# Patient Record
Sex: Male | Born: 1965 | Race: White | Hispanic: No
Health system: Midwestern US, Community
[De-identification: ages and names within clinical notes are randomized; demographics above are authoritative.]

## PROBLEM LIST (undated history)

## (undated) DIAGNOSIS — K219 Gastro-esophageal reflux disease without esophagitis: Secondary | ICD-10-CM

## (undated) DIAGNOSIS — R0789 Other chest pain: Secondary | ICD-10-CM

## (undated) HISTORY — DX: Gastro-esophageal reflux disease without esophagitis: K21.9

## (undated) HISTORY — PX: LUNG SURGERY: SHX703

---

## 2005-07-27 ENCOUNTER — Emergency Department (HOSPITAL_COMMUNITY): Admission: EM | Admit: 2005-07-27 | Discharge: 2005-07-27 | Payer: Self-pay | Admitting: Emergency Medicine

## 2005-08-06 ENCOUNTER — Emergency Department (HOSPITAL_COMMUNITY): Admission: EM | Admit: 2005-08-06 | Discharge: 2005-08-06 | Payer: Self-pay | Admitting: Family Medicine

## 2006-01-23 ENCOUNTER — Ambulatory Visit: Payer: Self-pay | Admitting: Psychiatry

## 2006-01-24 ENCOUNTER — Inpatient Hospital Stay (HOSPITAL_COMMUNITY): Admission: EM | Admit: 2006-01-24 | Discharge: 2006-01-24 | Payer: Self-pay | Admitting: Psychiatry

## 2006-07-24 ENCOUNTER — Ambulatory Visit: Payer: Self-pay | Admitting: Thoracic Surgery

## 2006-07-24 ENCOUNTER — Inpatient Hospital Stay (HOSPITAL_COMMUNITY): Admission: EM | Admit: 2006-07-24 | Discharge: 2006-08-01 | Payer: Self-pay | Admitting: Emergency Medicine

## 2006-07-25 ENCOUNTER — Encounter (INDEPENDENT_AMBULATORY_CARE_PROVIDER_SITE_OTHER): Payer: Self-pay | Admitting: *Deleted

## 2006-07-25 DIAGNOSIS — J869 Pyothorax without fistula: Secondary | ICD-10-CM | POA: Insufficient documentation

## 2006-07-25 DIAGNOSIS — B957 Other staphylococcus as the cause of diseases classified elsewhere: Secondary | ICD-10-CM

## 2006-07-29 ENCOUNTER — Encounter: Payer: Self-pay | Admitting: Thoracic Surgery (Cardiothoracic Vascular Surgery)

## 2006-07-29 ENCOUNTER — Ambulatory Visit: Payer: Self-pay | Admitting: Infectious Diseases

## 2006-08-12 ENCOUNTER — Encounter
Admission: RE | Admit: 2006-08-12 | Discharge: 2006-08-12 | Payer: Self-pay | Admitting: Thoracic Surgery (Cardiothoracic Vascular Surgery)

## 2006-08-12 ENCOUNTER — Encounter: Payer: Self-pay | Admitting: Infectious Diseases

## 2006-08-12 ENCOUNTER — Ambulatory Visit: Payer: Self-pay | Admitting: Thoracic Surgery (Cardiothoracic Vascular Surgery)

## 2006-08-22 ENCOUNTER — Ambulatory Visit: Payer: Self-pay | Admitting: Infectious Diseases

## 2006-08-22 DIAGNOSIS — Z9889 Other specified postprocedural states: Secondary | ICD-10-CM | POA: Insufficient documentation

## 2007-06-01 ENCOUNTER — Emergency Department (HOSPITAL_COMMUNITY): Admission: EM | Admit: 2007-06-01 | Discharge: 2007-06-02 | Payer: Self-pay | Admitting: Emergency Medicine

## 2008-04-04 IMAGING — CR DG CHEST 1V PORT
1 series · 1 of 1 positions shown · non-contrast
Comparison: 07/25/06.

CLINICAL DATA: Hemothorax drainage.
 CHEST PORTABLE - 1 VIEW ? 5070 HOURS:

[view not recorded]
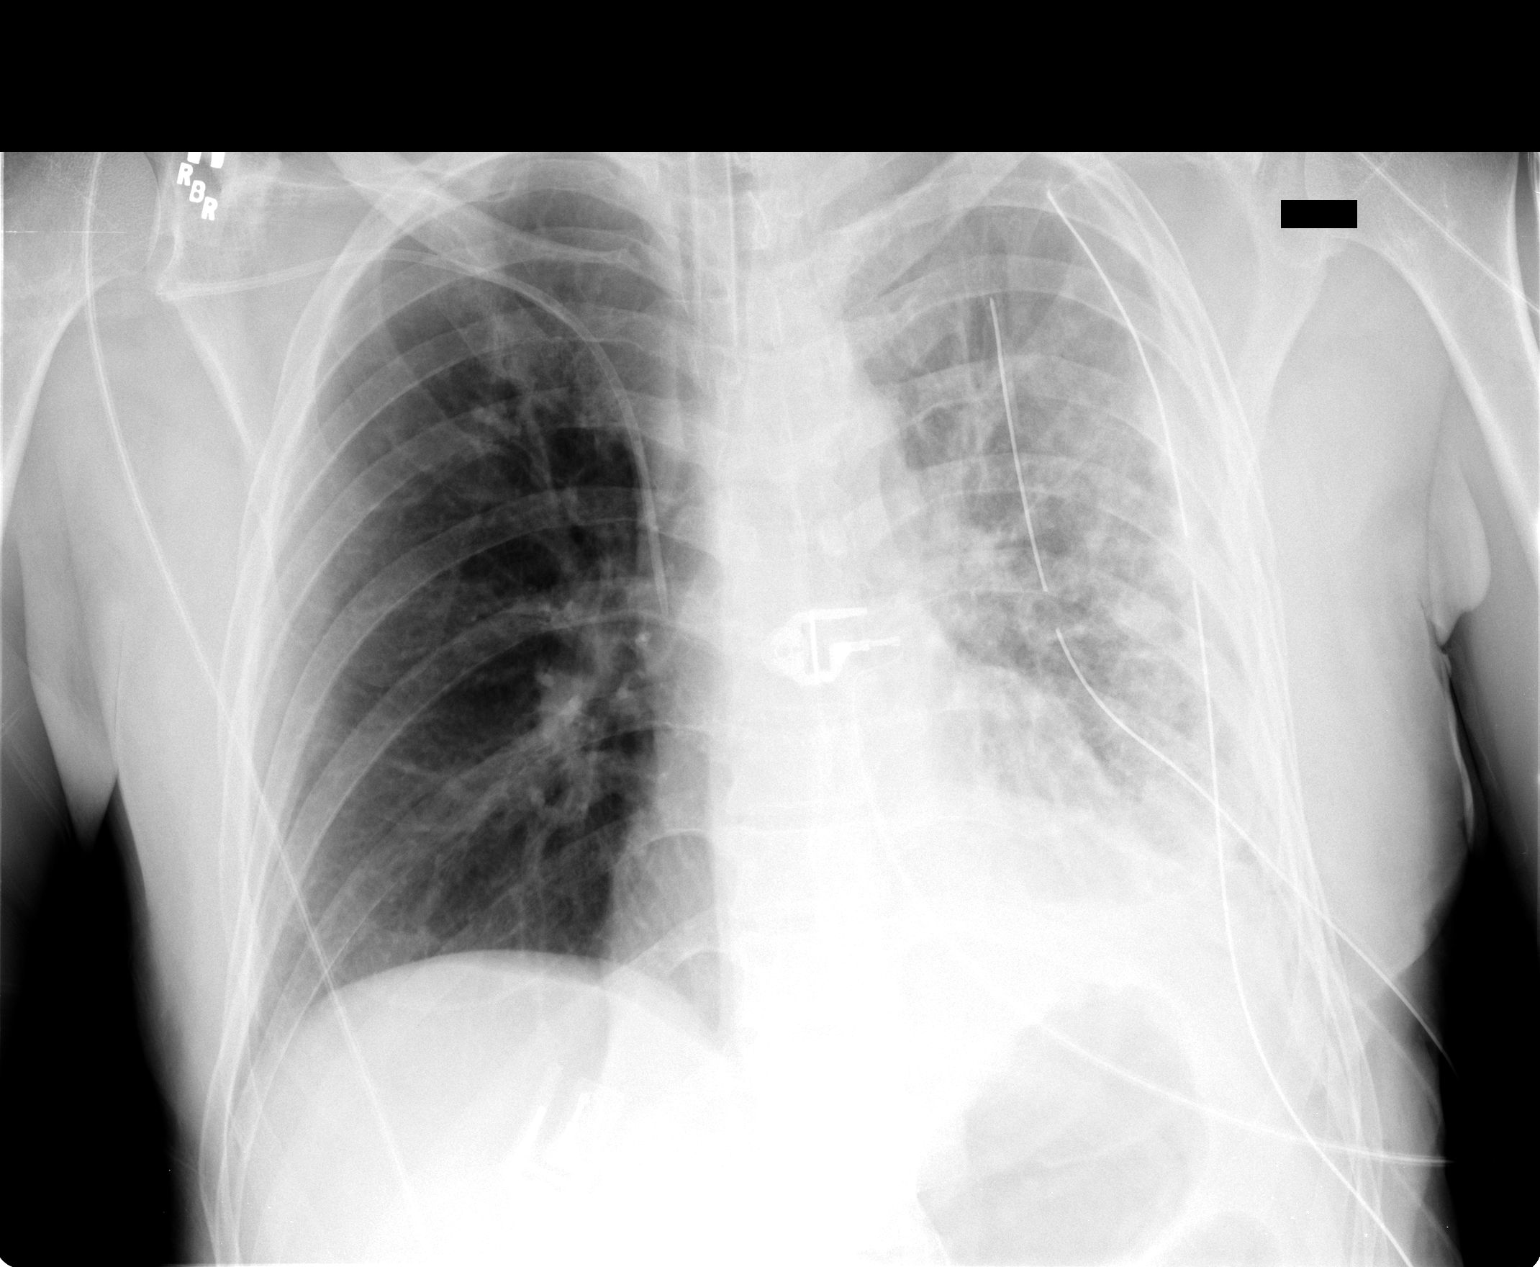

[1 of 1 positions shown; findings below may reference images not displayed]

FINDINGS: Two chest tubes are present on the left with improvement in left effusion. There remains some pleural thickening and air space disease on the left.  There is no pneumothorax.  An endotracheal tube is in good position.  The central line tip is in the SVC.  The right lung is clear.
IMPRESSION: Improvement in left effusion following chest tube placement.  No pneumothorax.

## 2008-04-06 IMAGING — CR DG CHEST 1V PORT
1 series · 1 of 1 positions shown · non-contrast
Comparison: Yesterday?s exam.

CLINICAL DATA: Pneumothorax.  Pleural effusion.  Postop VATS.  
 PORTABLE CHEST - 1 VIEW ([DATE] HOURS):

[view not recorded]
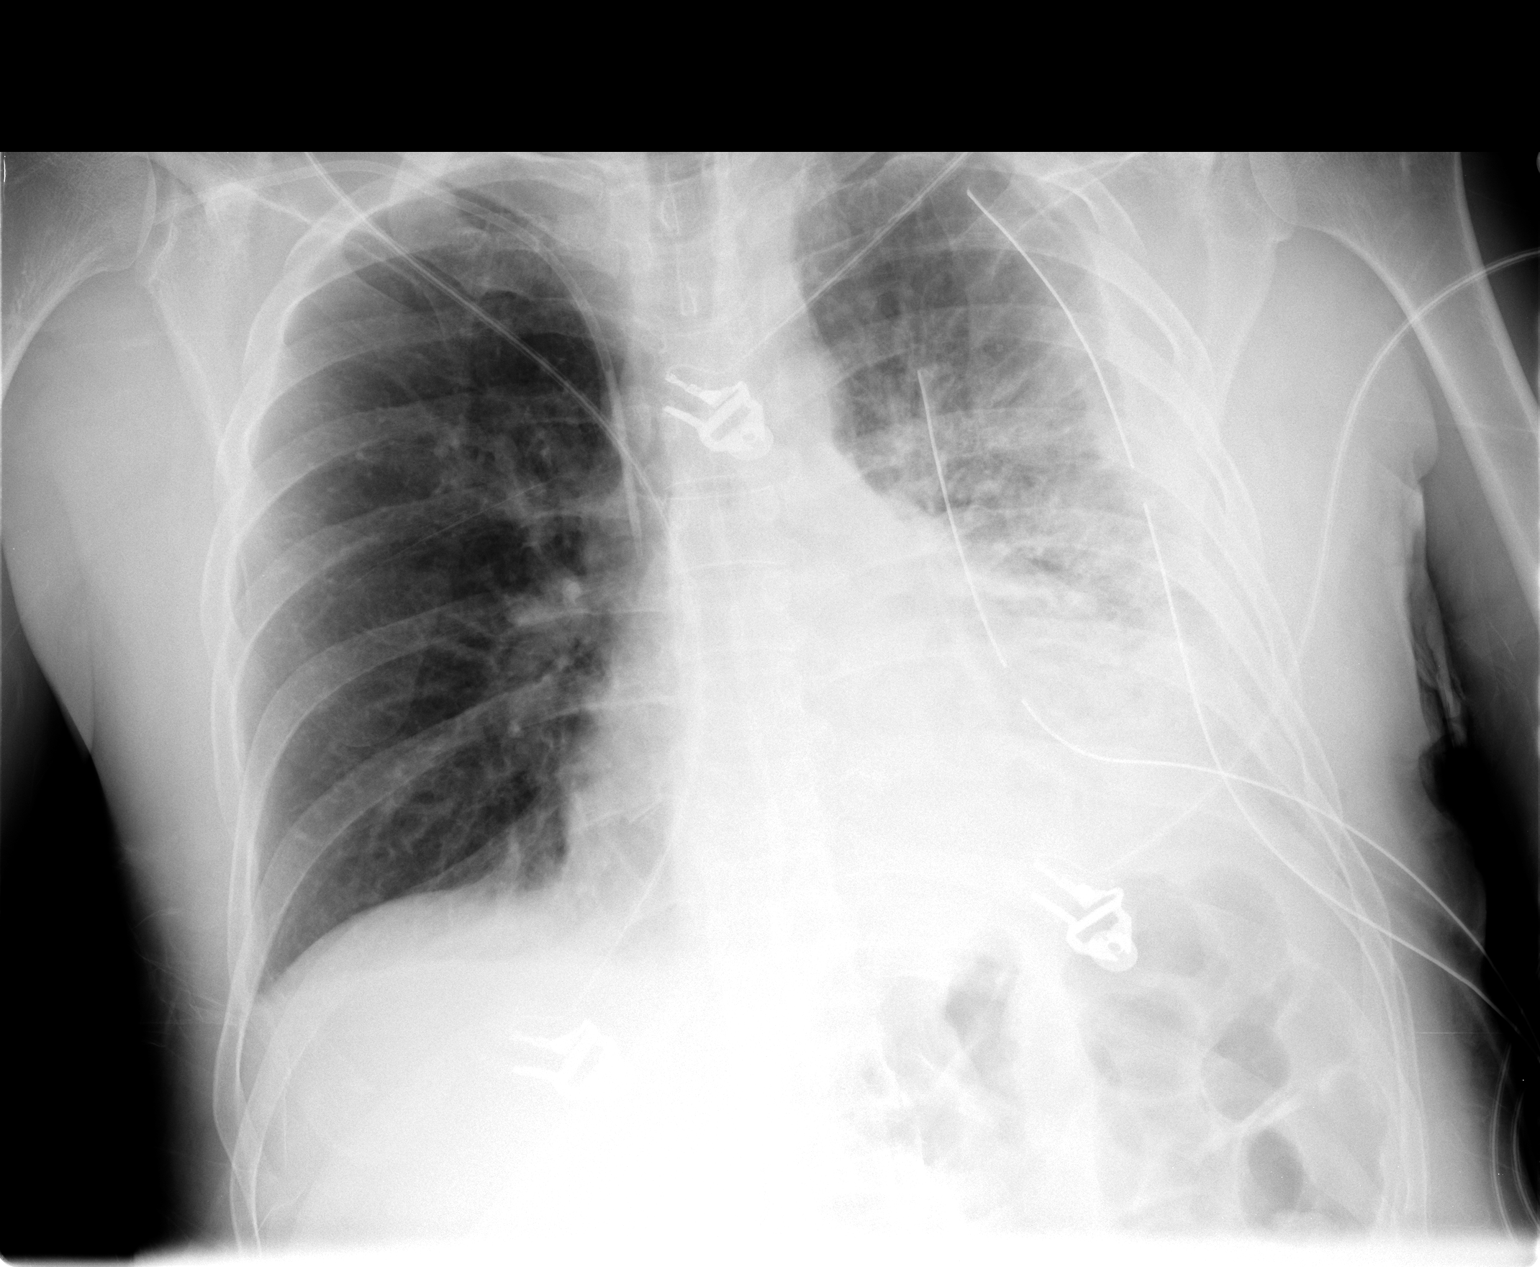

[1 of 1 positions shown; findings below may reference images not displayed]

FINDINGS: Two left pleural chest tubes in position.  Right subclavian CVC is in the SVC.  No pneumothorax.  According to this single view there has been an increase in pleural fluid on the left.  There is probably some compressive atelectasis of the left lung.  Generalized left lung under aeration.
IMPRESSION: Increase in left pleural effusion/pneumothorax and increased under aeration of the left lung.

## 2008-10-21 ENCOUNTER — Emergency Department (HOSPITAL_COMMUNITY): Admission: EM | Admit: 2008-10-21 | Discharge: 2008-10-21 | Payer: Self-pay | Admitting: Emergency Medicine

## 2008-12-29 ENCOUNTER — Emergency Department (HOSPITAL_COMMUNITY): Admission: EM | Admit: 2008-12-29 | Discharge: 2008-12-30 | Payer: Self-pay | Admitting: Emergency Medicine

## 2009-07-24 ENCOUNTER — Emergency Department (HOSPITAL_COMMUNITY): Admission: EM | Admit: 2009-07-24 | Discharge: 2009-07-24 | Payer: Self-pay | Admitting: Emergency Medicine

## 2009-07-30 ENCOUNTER — Emergency Department (HOSPITAL_COMMUNITY): Admission: EM | Admit: 2009-07-30 | Discharge: 2009-07-30 | Payer: Self-pay | Admitting: Emergency Medicine

## 2010-07-01 LAB — POCT I-STAT, CHEM 8
BUN: 7 mg/dL (ref 6–23)
Calcium, Ion: 1.1 mmol/L — ABNORMAL LOW (ref 1.12–1.32)
Chloride: 110 mEq/L (ref 96–112)
Creatinine, Ser: 0.9 mg/dL (ref 0.4–1.5)
Glucose, Bld: 104 mg/dL — ABNORMAL HIGH (ref 70–99)
HCT: 40 % (ref 39.0–52.0)
Hemoglobin: 13.6 g/dL (ref 13.0–17.0)
Potassium: 3.6 mEq/L (ref 3.5–5.1)
Sodium: 142 meq/L (ref 135–145)
TCO2: 24 mmol/L (ref 0–100)

## 2010-07-17 LAB — POCT CARDIAC MARKERS
CKMB, poc: 1 ng/mL (ref 1.0–8.0)
Myoglobin, poc: 74.8 ng/mL (ref 12–200)

## 2010-07-17 LAB — CBC
HCT: 41.6 % (ref 39.0–52.0)
Hemoglobin: 14.5 g/dL (ref 13.0–17.0)
MCHC: 34.9 g/dL (ref 30.0–36.0)
MCV: 103.6 fL — ABNORMAL HIGH (ref 78.0–100.0)

## 2010-07-17 LAB — BASIC METABOLIC PANEL
CO2: 23 mEq/L (ref 19–32)
Creatinine, Ser: 0.78 mg/dL (ref 0.4–1.5)
Glucose, Bld: 98 mg/dL (ref 70–99)
Sodium: 142 mEq/L (ref 135–145)

## 2010-07-17 LAB — DIFFERENTIAL
Basophils Absolute: 0.1 10*3/uL (ref 0.0–0.1)
Lymphocytes Relative: 38 % (ref 12–46)
Lymphs Abs: 2.1 10*3/uL (ref 0.7–4.0)
Monocytes Absolute: 0.7 10*3/uL (ref 0.1–1.0)
Monocytes Relative: 14 % — ABNORMAL HIGH (ref 3–12)

## 2010-07-17 LAB — ETHANOL: Alcohol, Ethyl (B): 201 mg/dL — ABNORMAL HIGH (ref 0–10)

## 2010-07-17 LAB — D-DIMER, QUANTITATIVE: D-Dimer, Quant: 0.83 ug/mL-FEU — ABNORMAL HIGH (ref 0.00–0.48)
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
## 2011-04-04 IMAGING — CT CT HEAD W/O CM
3 of 5 series · 15 of 37 positions shown, 18 images · non-contrast
Comparison: None.

CT HEAD

CLINICAL DATA: Status post fall.  Dizziness prior to fall.  Loss
of consciousness.  Progressive head and neck pain.

CT HEAD WITHOUT CONTRAST
CT CERVICAL SPINE WITHOUT CONTRAST
TECHNIQUE: Multidetector CT imaging of the head and cervical spine
was performed following the standard protocol without intravenous
contrast.  Multiplanar CT image reconstructions of the cervical
spine were also generated.

[Series 2: brain · axial · 0.48mm/px · z∈[-85,-9]mm · 3 of 40 slices shown]
[im 10/40  brain]
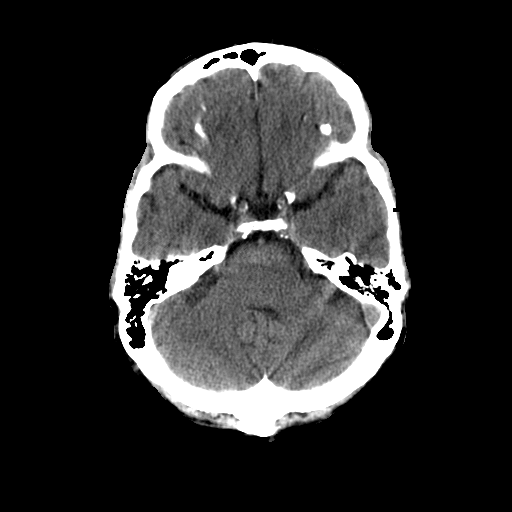
[im 20/40  brain]
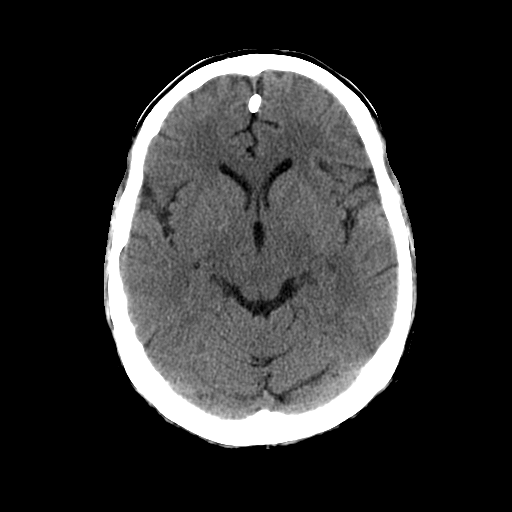
[im 30/40  brain]
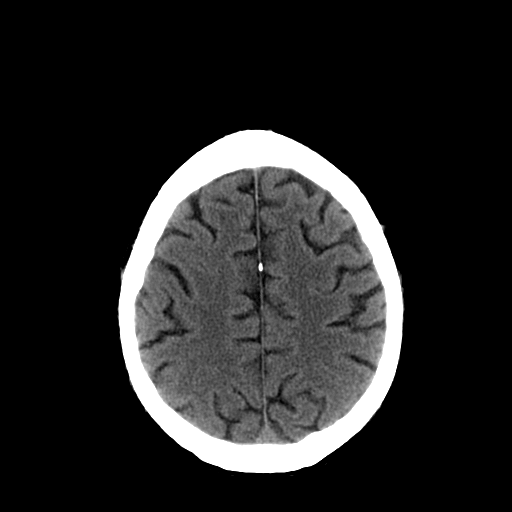

[Series 3: recon 2: brain · axial · 0.48mm/px · z∈[-105,+25]mm · 9 of 80 slices shown, 12 images]
[im 8/80  brain]
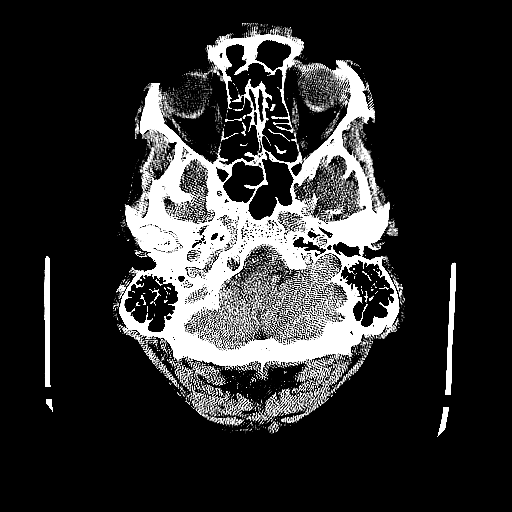
[im 8/80  bone]
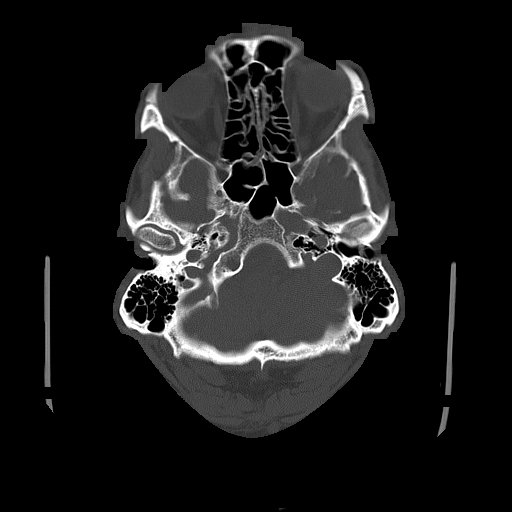
[im 16/80  brain]
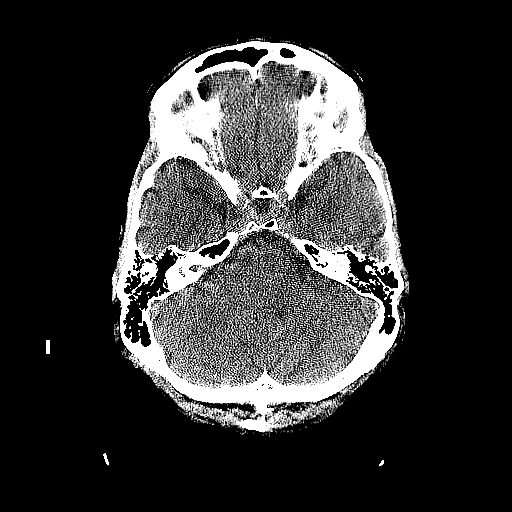
[im 24/80  brain]
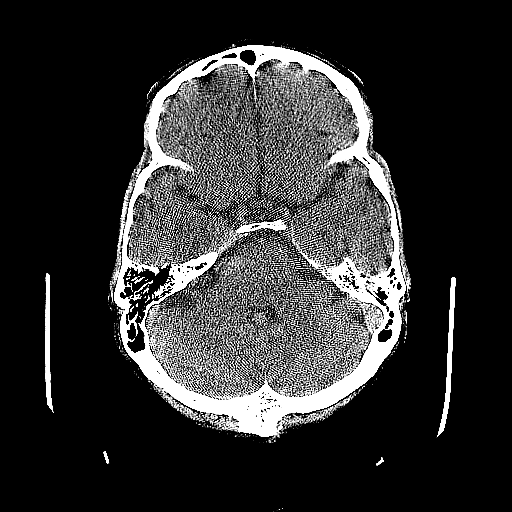
[im 32/80  brain]
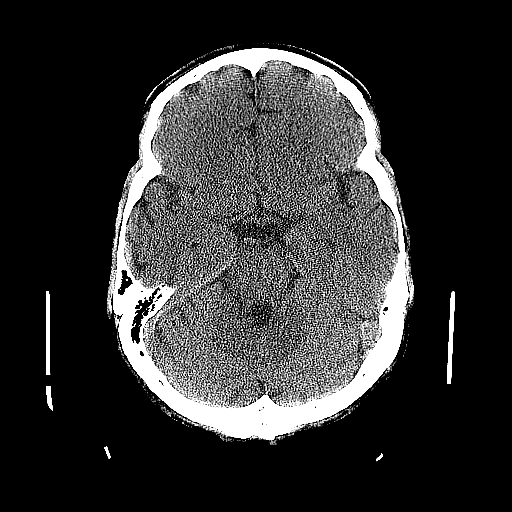
[im 40/80  brain]
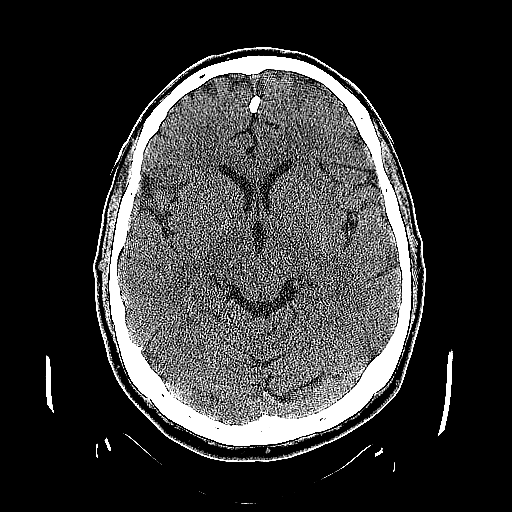
[im 40/80  bone]
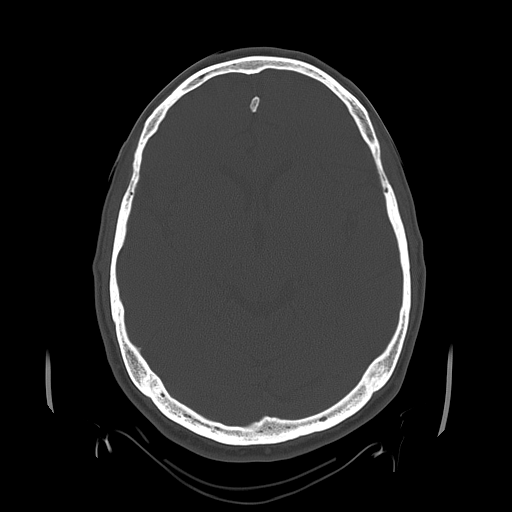
[im 48/80  brain]
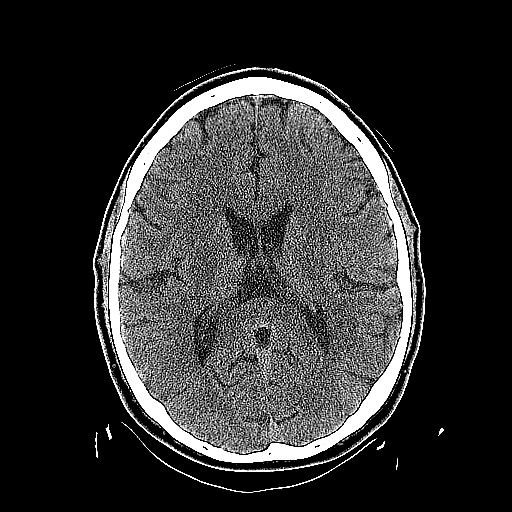
[im 56/80  brain]
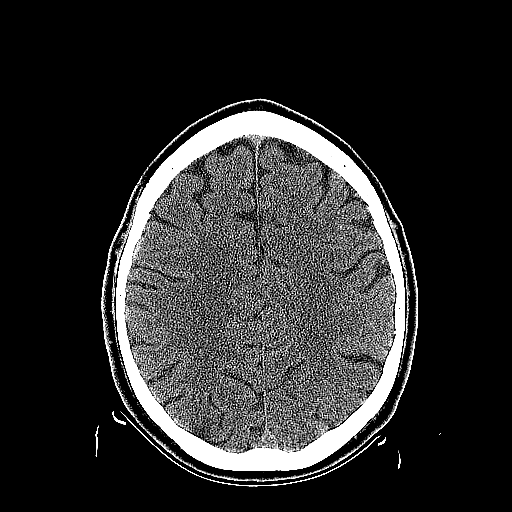
[im 64/80  brain]
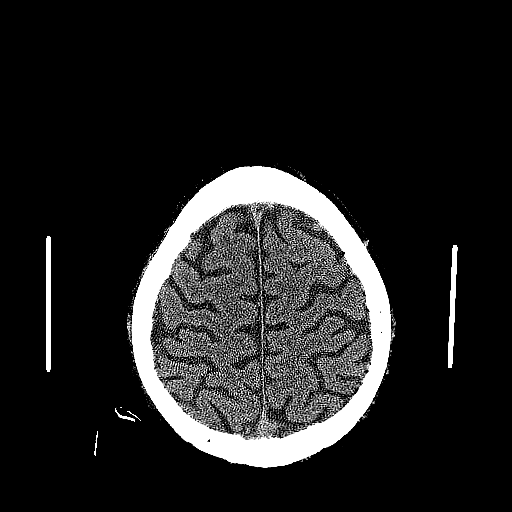
[im 72/80  brain]
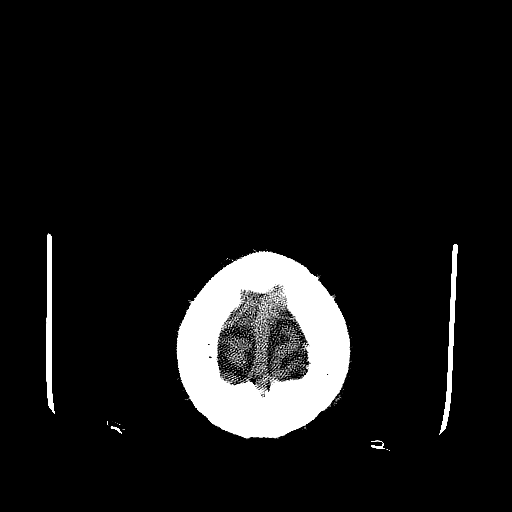
[im 72/80  bone]
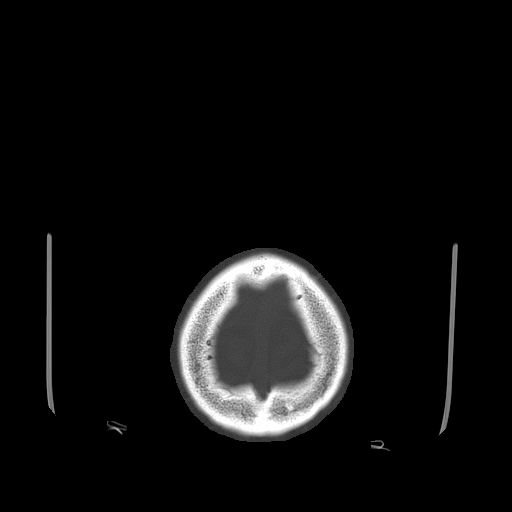

[Series 600: sag · sagittal · 0.35mm/px · 3 of 37 slices shown]
[im 22/37  brain]
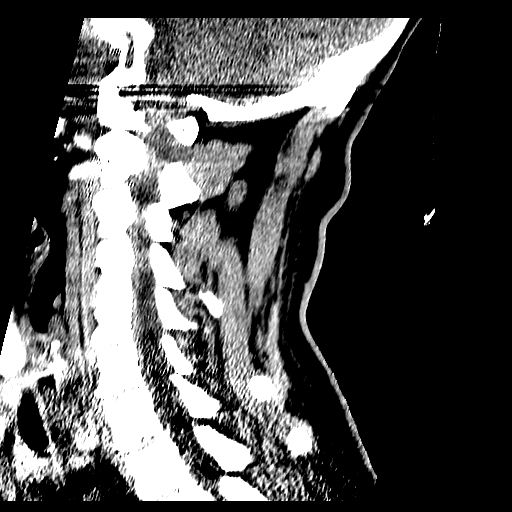
[im 26/37  brain]
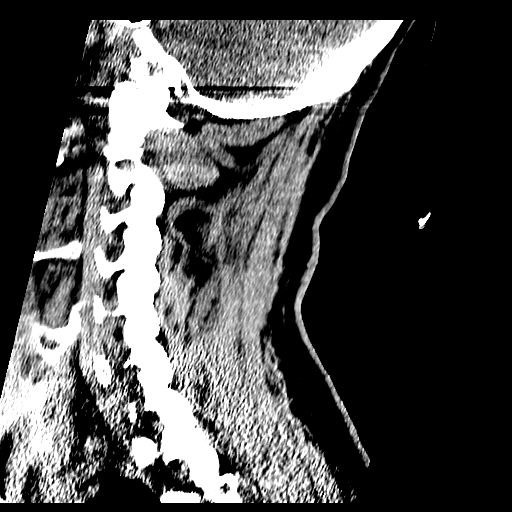
[im 29/37  brain]
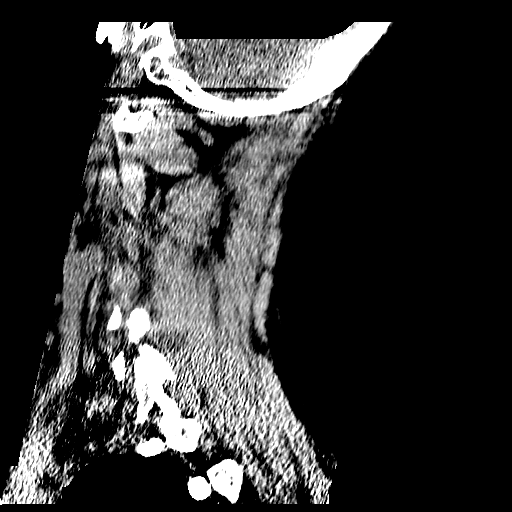

[15 of 37 positions shown; findings below may reference images not displayed]

FINDINGS: No acute intracranial abnormality is present.
Specifically, there is no evidence for acute infarct, hemorrhage,
mass, hydrocephalus, or extra-axial fluid collection.  The
paranasal sinuses and mastoid air cells are clear.  The globes and
orbits are intact.  The osseous skull is intact.
IMPRESSION: Normal CT of the head.

CT CERVICAL SPINE
FINDINGS: The cervical spine is visualized from skull base through
T1-2.  The vertebral body heights are maintained.  Alignment is
anatomic.  No acute fracture or traumatic subluxation is evident.
Mild facet degenerative changes are noted at C7-T1 and T1-2.  The
lung apices are clear.  Atherosclerotic calcifications are noted at
the left carotid bifurcation.  The soft tissues are otherwise
unremarkable.
IMPRESSION: 1.  No acute fracture or traumatic subluxation.
2.  Minimal degenerative change at the cervicothoracic junction.
3.  Atherosclerotic calcifications of the left carotid bifurcation.

## 2011-09-18 MED ORDER — DIPHTH,PERTUS(AC)TETANUS VAC(PF) 2.5 LF UNIT-8 MCG-5 LF/0.5 ML INJ
INTRAMUSCULAR | Status: AC
Start: 2011-09-18 — End: 2011-09-18
  Administered 2011-09-18: 13:00:00 via INTRAMUSCULAR

## 2011-09-18 MED ORDER — OXYCODONE-ACETAMINOPHEN 5 MG-325 MG TAB
5-325 mg | ORAL_TABLET | Freq: Four times a day (QID) | ORAL | Status: DC | PRN
Start: 2011-09-18 — End: 2012-02-02

## 2011-09-18 MED ORDER — CEPHALEXIN 500 MG CAP
500 mg | ORAL_CAPSULE | Freq: Three times a day (TID) | ORAL | Status: AC
Start: 2011-09-18 — End: 2011-09-25

## 2011-09-18 MED FILL — BOOSTRIX TDAP 2.5 LF UNIT-8 MCG-5 LF/0.5 ML INTRAMUSCULAR SYRINGE: INTRAMUSCULAR | Qty: 1

## 2011-09-18 MED FILL — SALINE FLUSH INJECTION SYRINGE: INTRAMUSCULAR | Qty: 40

## 2011-09-18 NOTE — ED Notes (Signed)
PA reviewed d/c papers w/ pt. No complaints or requests at this time.  Respirations are equal and unlabored. Pt is alert and oriented.  Pt ambulatory out
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
## 2011-11-15 LAB — METABOLIC PANEL, COMPREHENSIVE
A-G Ratio: 1 — ABNORMAL LOW (ref 1.1–2.2)
ALT (SGPT): 18 U/L (ref 12–78)
AST (SGOT): 22 U/L (ref 15–37)
Albumin: 3.8 g/dL (ref 3.5–5.0)
Alk. phosphatase: 78 U/L (ref 50–136)
Anion gap: 10 mmol/L (ref 5–15)
BUN/Creatinine ratio: 7 — ABNORMAL LOW (ref 12–20)
BUN: 5 MG/DL — ABNORMAL LOW (ref 6–20)
Bilirubin, total: 0.7 MG/DL (ref 0.2–1.0)
CO2: 24 MMOL/L (ref 21–32)
Calcium: 8.8 MG/DL (ref 8.5–10.1)
Chloride: 103 MMOL/L (ref 97–108)
Creatinine: 0.76 MG/DL (ref 0.45–1.15)
GFR est AA: >60 mL/min/{1.73_m2} (ref >60)
GFR est non-AA: >60 mL/min/{1.73_m2} (ref >60)
Globulin: 3.9 g/dL (ref 2.0–4.0)
Glucose: 89 MG/DL (ref 65–100)
Potassium: 3 MMOL/L — ABNORMAL LOW (ref 3.5–5.1)
Protein, total: 7.7 g/dL (ref 6.4–8.2)
Sodium: 137 MMOL/L (ref 136–145)

## 2011-11-15 LAB — CBC WITH AUTOMATED DIFF
ABS. BASOPHILS: 0.1 10*3/uL (ref 0.0–0.1)
ABS. EOSINOPHILS: 0.1 10*3/uL (ref 0.0–0.4)
ABS. LYMPHOCYTES: 1.2 10*3/uL (ref 0.8–3.5)
ABS. MONOCYTES: 0.8 10*3/uL (ref 0.0–1.0)
ABS. NEUTROPHILS: 4.4 10*3/uL (ref 1.8–8.0)
BASOPHILS: 1 % (ref 0–1)
EOSINOPHILS: 2 % (ref 0–7)
HCT: 46 % (ref 36.6–50.3)
HGB: 16.7 g/dL (ref 12.1–17.0)
LYMPHOCYTES: 18 % (ref 12–49)
MCH: 34.8 PG — ABNORMAL HIGH (ref 26.0–34.0)
MCHC: 36.3 g/dL (ref 30.0–36.5)
MCV: 95.8 FL (ref 80.0–99.0)
MONOCYTES: 13 % (ref 5–13)
NEUTROPHILS: 66 % (ref 32–75)
PLATELET: 179 10*3/uL (ref 150–400)
RBC: 4.8 M/uL (ref 4.10–5.70)
RDW: 12.9 % (ref 11.5–14.5)
WBC: 6.5 10*3/uL (ref 4.1–11.1)

## 2011-11-15 LAB — LIPASE: Lipase: 206 U/L (ref 73–393)

## 2011-11-15 MED ORDER — CIPROFLOXACIN 500 MG TAB
500 mg | ORAL_TABLET | Freq: Two times a day (BID) | ORAL | Status: AC
Start: 2011-11-15 — End: 2011-11-25

## 2011-11-15 MED ORDER — PROMETHAZINE 25 MG TAB
25 mg | ORAL_TABLET | Freq: Four times a day (QID) | ORAL | Status: DC | PRN
Start: 2011-11-15 — End: 2012-02-02

## 2011-11-15 MED ORDER — METRONIDAZOLE 500 MG TAB
500 mg | ORAL_TABLET | Freq: Two times a day (BID) | ORAL | Status: AC
Start: 2011-11-15 — End: 2011-11-25

## 2011-11-15 MED ORDER — OXYCODONE-ACETAMINOPHEN 5 MG-325 MG TAB
5-325 mg | ORAL_TABLET | ORAL | Status: DC | PRN
Start: 2011-11-15 — End: 2012-02-02

## 2011-11-15 MED ORDER — PROMETHAZINE 25 MG RECTAL SUPPOSITORY
25 mg | Freq: Four times a day (QID) | RECTAL | Status: DC | PRN
Start: 2011-11-15 — End: 2012-02-02

## 2011-11-15 MED ADMIN — sodium chloride 0.9 % bolus infusion 1,000 mL: INTRAVENOUS | @ 10:00:00 | NDC 00409798309

## 2011-11-15 MED ADMIN — ketorolac (TORADOL) injection 30 mg: INTRAVENOUS | @ 10:00:00 | NDC 00409379501

## 2011-11-15 MED ADMIN — potassium chloride SR (KLOR-CON 10) tablet 40 mEq: ORAL | @ 12:00:00 | NDC 00245004189

## 2011-11-15 MED ADMIN — dicyclomine (BENTYL) tablet 20 mg: ORAL | @ 10:00:00 | NDC 51079011901

## 2011-11-15 MED ADMIN — ondansetron (ZOFRAN) injection 4 mg: INTRAVENOUS | @ 10:00:00 | NDC 00143989105

## 2011-11-15 MED FILL — ONDANSETRON (PF) 4 MG/2 ML INJECTION: 4 mg/2 mL | INTRAMUSCULAR | Qty: 2

## 2011-11-15 MED FILL — K-TAB 10 MEQ TABLET,EXTENDED RELEASE: 10 mEq | ORAL | Qty: 4

## 2011-11-15 MED FILL — DICYCLOMINE 20 MG TAB: 20 mg | ORAL | Qty: 1

## 2011-11-15 MED FILL — KETOROLAC TROMETHAMINE 30 MG/ML INJECTION: 30 mg/mL (1 mL) | INTRAMUSCULAR | Qty: 1

## 2011-11-15 NOTE — ED Notes (Signed)
Pt resting quietly in bed, no ss distress. Cb in reach

## 2011-11-15 NOTE — ED Notes (Signed)
Dr. Hughes at bedside evaluating patient.

## 2011-11-15 NOTE — ED Notes (Signed)
Assumed care of patient, pt placed in bed in position of comfort.  Pt reports N/V since Saturday; pt also c/o lower abd stomach pain and reports "my stomach feels like its in a knot." Pt reports he is unable to keep any food or liquids down.  Cb in reach

## 2011-11-15 NOTE — ED Notes (Signed)
Bedside and Verbal shift change report given to Glee Arvin, RN (oncoming nurse) by Arlester Marker, RN (offgoing nurse).  Report given with SBAR, ED Summary and Recent Results.    Patient resting in position of comfort on stretcher, call bell within reach and instructions provided. Bed in lowest position with wheels locked. Patient alert and oriented x4 and appears to be in no apparent distress. Pain rated a 5/10. Patient denies any questions, needs, or wants at this time.

## 2011-11-15 NOTE — ED Provider Notes (Signed)
HPI Comments: 46 y.o male presents ambulatory to [facility] ED with cc of LLQ ABD pain (8/10) and N/V/D x 2 days. Pt reports sx's worsened last night and had onset of chills so he decided come to ED this morning for evaluation. Pt denies any hx of diverticular disease or hx of EtOH abuse. Pt denies any CP, SOB, fever, cough, congestion, back pain, blood in stool, HA, diaphoresis, or rash.    Social Hx: +tobacco, +EtOH    There are no other complaints, changes or physical findings at this time.   Written by Jackelyn Hoehn, ED Scribe, as dictated by Tonie Griffith, MD.      The history is provided by the patient.        History reviewed. No pertinent past medical history.     Past Surgical History   Procedure Date   ??? Hx orthopaedic      bilateral knees         History reviewed. No pertinent family history.     History     Social History   ??? Marital Status: DIVORCED     Spouse Name: N/A     Number of Children: N/A   ??? Years of Education: N/A     Occupational History   ??? Not on file.     Social History Main Topics   ??? Smoking status: Current Everyday Smoker -- 1.0 packs/day   ??? Smokeless tobacco: Not on file   ??? Alcohol Use: Yes      6/week   ??? Drug Use:    ??? Sexually Active:      Other Topics Concern   ??? Not on file     Social History Narrative   ??? No narrative on file                  ALLERGIES: Sulfa (sulfonamide antibiotics)      Review of Systems   Constitutional: Positive for chills. Negative for fever and diaphoresis.   HENT: Negative.  Negative for congestion.    Eyes: Negative.    Respiratory: Negative.  Negative for cough and shortness of breath.    Cardiovascular: Negative.  Negative for chest pain.   Gastrointestinal: Positive for nausea, vomiting, abdominal pain and diarrhea. Negative for blood in stool.   Genitourinary: Negative.    Musculoskeletal: Negative.  Negative for back pain.   Skin: Negative.  Negative for rash.   Neurological: Negative.  Negative for headaches.   All other systems reviewed and are  negative.        Filed Vitals:    11/15/11 0553   BP: 170/114   Pulse: 93   Temp: 97.9 ??F (36.6 ??C)   Resp: 22   Height: 5\' 9"  (1.753 m)   Weight: 63.8 kg (140 lb 10.5 oz)   SpO2: 97%            Physical Exam   Nursing note and vitals reviewed.  Constitutional: He is oriented to person, place, and time. He appears well-developed and well-nourished. No distress.   HENT:   Head: Normocephalic and atraumatic.   Mouth/Throat: Oropharynx is clear and moist.   Eyes: Conjunctivae and EOM are normal. Pupils are equal, round, and reactive to light.   Neck: Normal range of motion. Neck supple. No JVD present. No tracheal deviation present.   Cardiovascular: Normal rate, regular rhythm, normal heart sounds and intact distal pulses.    Pulmonary/Chest: Effort normal and breath sounds normal. No stridor. No  respiratory distress. He has no wheezes. He has no rales.   Abdominal: Soft. There is tenderness (LLQ, neg McBurney's). There is no rebound and no guarding.   Musculoskeletal: Normal range of motion. He exhibits no edema.   Neurological: He is alert and oriented to person, place, and time. No cranial nerve deficit.        No focal motor or sensory deficits  '   Skin: Skin is warm and dry. He is not diaphoretic.   Psychiatric: He has a normal mood and affect. His behavior is normal.        MDM     Differential Diagnosis; Clinical Impression; Plan:     Colitis, diverticulitis, gastritis  Amount and/or Complexity of Data Reviewed:   Clinical lab tests:  Ordered and reviewed      Procedures    6:45 AM  Pt has been reevaluated and discussed possibility of CT for further evaluation. Pt states he would like to defer CT at this time and wait for lab results. Pt has been advised he will likely be treated for diverticulitis or colitis.  Written by Jackelyn Hoehn, ED Scribe, as dictated by Dr. Kizzie Bane.    Progress Note  7:46 AM   Evette Doffing, DO has re-evaluated pt, and has reviewed all available results w/ pt and/or family, as well as  plan of care.  Pt has no other complaints at this time, and is ready for discharge.  Written by R. Pearline Cables, ED Scribe, as dictated by Dr. Kizzie Bane.        LABORATORY TESTS:  Recent Results (from the past 12 hour(s))   CBC WITH AUTOMATED DIFF    Collection Time    11/15/11  6:25 AM       Component Value Range    WBC 6.5  4.1 - 11.1 K/uL    RBC 4.80  4.10 - 5.70 M/uL    HGB 16.7  12.1 - 17.0 g/dL    HCT 16.1  09.6 - 04.5 %    MCV 95.8  80.0 - 99.0 FL    MCH 34.8 (*) 26.0 - 34.0 PG    MCHC 36.3  30.0 - 36.5 g/dL    RDW 40.9  81.1 - 91.4 %    PLATELET 179  150 - 400 K/uL    NEUTROPHILS 66  32 - 75 %    LYMPHOCYTES 18  12 - 49 %    MONOCYTES 13  5 - 13 %    EOSINOPHILS 2  0 - 7 %    BASOPHILS 1  0 - 1 %    ABS. NEUTROPHILS 4.4  1.8 - 8.0 K/UL    ABS. LYMPHOCYTES 1.2  0.8 - 3.5 K/UL    ABS. MONOCYTES 0.8  0.0 - 1.0 K/UL    ABS. EOSINOPHILS 0.1  0.0 - 0.4 K/UL    ABS. BASOPHILS 0.1  0.0 - 0.1 K/UL   METABOLIC PANEL, COMPREHENSIVE    Collection Time    11/15/11  6:25 AM       Component Value Range    Sodium 137  136 - 145 MMOL/L    Potassium 3.0 (*) 3.5 - 5.1 MMOL/L    Chloride 103  97 - 108 MMOL/L    CO2 24  21 - 32 MMOL/L    Anion gap 10  5 - 15 mmol/L    Glucose 89  65 - 100 MG/DL    BUN 5 (*) 6 - 20 MG/DL  Creatinine 0.76  0.45 - 1.15 MG/DL    BUN/Creatinine ratio 7 (*) 12 - 20      GFR est-AA >60  >60 ml/min/1.38m2    GFR est non-AA >60  >60 ml/min/1.90m2    Calcium 8.8  8.5 - 10.1 MG/DL    Bilirubin, total 0.7  0.2 - 1.0 MG/DL    ALT 18  12 - 78 U/L    AST 22  15 - 37 U/L    Alk. phosphatase 78  50 - 136 U/L    Protein, total 7.7  6.4 - 8.2 g/dL    Albumin 3.8  3.5 - 5.0 g/dL    Globulin 3.9  2.0 - 4.0 g/dL    A-G Ratio 1.0 (*) 1.1 - 2.2     LIPASE    Collection Time    11/15/11  6:25 AM       Component Value Range    Lipase 206  73 - 393 U/L       IMAGING RESULTS:    MEDICATIONS GIVEN:    Medications   potassium chloride SR (KLOR-CON 10) tablet 40 mEq (not administered)   sodium chloride 0.9 % bolus infusion 1,000 mL  (1000 mL IntraVENous New Bag 11/15/11 0623)   dicyclomine (BENTYL) tablet 20 mg (20 mg Oral Given 11/15/11 0621)   ondansetron (ZOFRAN) injection 4 mg (4 mg IntraVENous Given 11/15/11 0621)   ketorolac (TORADOL) injection 30 mg (30 mg IntraVENous Given 11/15/11 0621)       IMPRESSION:  1. Nausea with vomiting    2. Diarrhea    3. Abdominal pain, LLQ (left lower quadrant)          7:46 AM  I reviewed our electronic medical record system for any past medical records that were available that may contribute to the patients current condition, the nursing notes and and vital signs from today's visit.  Written by R. Pearline Cables, ED Scribe, as dictated by Dr. Kizzie Bane.     7:46 AM   The patient's lab results have been reviewed with them.  Patient and/or family verbally conveyed their understanding and agreement of the patient's signs, symptoms, diagnosis, treatment and prognosis and agree to follow up as recommended, or return to the Emergency Room should their condition change prior to their follow-up appointment. The patient verbally agrees with the care-plan and verbally conveys that all of their questions have been answered. The discharge instructions have also been provided to the patient with some educational information regarding their diagnosis as well a list of reasons why they would want to return to the ER prior to their follow-up appointment, should their condition change.    Written by R. Pearline Cables, ED Scribe, as dictated by Dr. Kizzie Bane.

## 2011-11-15 NOTE — ED Notes (Deleted)
Bedside and Verbal shift change report given to Glee Arvin, RN (oncoming nurse) by Arlester Marker, RN (offgoing nurse).  Report given with SBAR, ED Summary and Recent Results.     Patient alert and oriented x4. Patient appears to be in no apparent distress at this time. patient resting on stretcher in position of comfort at this time. Patient request a meal tray; and informed that we will speak with doctor about eating.    Patient call bell within reach; patient vitals updated. Bed in lowest position with wheel locked and side rails up x2. Instructions on call bell use provided to patient.     Patient denies any other complaints, needs, or wants at this time.

## 2011-11-15 NOTE — ED Notes (Signed)
Bedside and Verbal shift change report given to Barbie Banner (oncoming nurse) by Garlan Fair (offgoing nurse).  Report given with SBAR, Kardex, ED Summary, Procedure Summary, Intake/Output, MAR and Recent Results.

## 2011-11-15 NOTE — ED Notes (Signed)
Patient medicated and resting comfortably on stretcher. Patient updated on plan of care and states that he understands. Patient denies any questions at this time.

## 2011-11-15 NOTE — ED Notes (Signed)
Patient discharged by Dr Kizzie Bane. Patient provided with discharge instructions Rx and instructions on follow up care. IV removed from L AC with catheter intact. Patient ambulatory out of ED under own power with steady gait.

## 2012-02-02 NOTE — ED Notes (Signed)
Scribe Sheria Lang made aware for Dr. Roxan Hockey that pt was unable to tolerate holding left eye open for visual acuity. Pt left eye noted to be red with swelling to lower lid.

## 2012-02-02 NOTE — ED Provider Notes (Signed)
HPI Comments: Lance Sanders is a 46 y.o. male presenting ambulatory to ED c/o progressively worsening L eye pain x 2 days. Pt reports that he has not been able to open his eye with the pain worsening tonight. 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 Pt states that his eye has been watering and feels like there is something in it. Pt denies taking any medications or having a PCP. Pt denies any vomiting, HA, or F/C.      PSHx Significant For: BL knee orthopedic  Social Hx: + tobacco, + EtOH, - illicit drugs    There are no other complaints, changes or physical findings at this time.   Written by Marlowe Aschoff, ED Scribe, as dictated by Williemae Natter. Roxan Hockey, MD.      The history is provided by the patient. No language interpreter was used.        History reviewed. No pertinent past medical history.     Past Surgical History   Procedure Laterality Date   ??? Hx orthopaedic       bilateral knees         History reviewed. No pertinent family history.     History     Social History   ??? Marital Status: DIVORCED     Spouse Name: N/A     Number of Children: N/A   ??? Years of Education: N/A     Occupational History   ??? Not on file.     Social History Main Topics   ??? Smoking status: Current Every Day Smoker -- 1.00 packs/day   ??? Smokeless tobacco: Not on file   ??? Alcohol Use: Yes      Comment: 6/week   ??? Drug Use:    ??? Sexually Active:      Other Topics Concern   ??? Not on file     Social History Narrative   ??? No narrative on file                  ALLERGIES: Sulfa (sulfonamide antibiotics)      Review of Systems   Constitutional: Negative.  Negative for fever and chills.   HENT: Negative.    Eyes: Positive for pain and visual disturbance.   Respiratory: Negative.    Cardiovascular: Negative.    Gastrointestinal: Negative.  Negative for vomiting.   Genitourinary: Negative.    Musculoskeletal: Negative.    Skin: Negative.    Neurological: Negative for weakness, numbness and  headaches.   All other systems reviewed and are negative.        Filed Vitals:    02/02/12 2133   BP: 133/105   Pulse: 98   Temp: 97.8 ??F (36.6 ??C)   Resp: 18   Height: 5\' 9"  (1.753 m)   Weight: 63.9 kg (140 lb 14 oz)   SpO2: 95%            Physical Exam   Nursing note and vitals reviewed.  Constitutional: He is oriented to person, place, and time. He appears well-developed and well-nourished. No distress.   HENT:   Head: Normocephalic and atraumatic.   Mouth/Throat: Oropharynx is clear and moist and mucous membranes are normal.   Eyes: EOM are normal. Pupils are equal, round, and reactive to light. Foreign body present in the left eye. Left conjunctiva is injected.   Corneal abrasion at 3 o clock position.   Neck: Trachea normal and normal range of  motion. Neck supple. No rigidity.   Cardiovascular: Normal rate, regular rhythm, normal heart sounds and intact distal pulses.    Pulmonary/Chest: Effort normal and breath sounds normal. No respiratory distress.   Abdominal: Soft. Bowel sounds are normal. There is no tenderness.   Musculoskeletal: Normal range of motion. He exhibits no edema and no tenderness.   Neurological: He is alert and oriented to person, place, and time. He has normal strength. No sensory deficit.   No focal deficits   Skin: Skin is warm, dry and intact.   Psychiatric: He has a normal mood and affect. Thought content normal.   Written by Marlowe Aschoff, ED Scribe; as dictated by Williemae Natter. Roxan Hockey, MD       MDM     Amount and/or Complexity of Data Reviewed:    Review and summarize past medical records:  Yes  Progress:   Patient progress:  Stable      Procedures    Procedure Note - Wood's lamp exam:  10:50 PM  Performed by: Williemae Natter. Roxan Hockey, MD  Pt???s L eye was anesthetized with tetracaine, stained with fluorescein, and examined with a Wood's lamp, using lid eversion.    Foreign body: no  Fluorescein uptake: yes, showing abrasion at 3 o clock position  The procedure took 1-15 minutes, and pt  tolerated well.  Written by Marlowe Aschoff, ED Scribe, as dictated by Williemae Natter. Roxan Hockey, MD.      Visual Acuity test 10/30 in the R eye with glasses. L eye unopened.  Written by Marlowe Aschoff, ED Scribe; as dictated by Williemae Natter. Roxan Hockey, MD        MEDICATIONS GIVEN:  Medications - No data to display    IMPRESSION:  1. Corneal abrasion, left        PLAN:  1. Discharge home with Norco, Floxin  2. Follow up with Opthalmology in the AM    Return to ED if worse     11:01 PM  Pt has been re-evaluated and is feeling much better. All diagnostic results have been reviewed and discussed with the pt. Care plans have been reviewed and pt understands all current sx, dx, tx, and rx. There are no further complaints, changes, or physical findings at this time. All questions have been addressed. All medications have been reviewed with pt; will discharge home with Floxin and Norco. Pt has been instructed and agrees to follow up with Opthalmology in the AM , as well as return to ED upon further deterioration. Pt is ready to go home.  Written by Marlowe Aschoff, ED Scribe, as dictated by Williemae Natter. Roxan Hockey, MD.

## 2012-02-02 NOTE — ED Notes (Signed)
Dr. Roxan Hockey has reviewed discharge instructions with the patient.  The patient verbalized understanding. Pt escorted to car where mother Lance Sanders was there to drive pt home.

## 2016-05-17 ENCOUNTER — Emergency Department

## 2016-05-17 ENCOUNTER — Inpatient Hospital Stay
Admit: 2016-05-17 | Discharge: 2016-05-17 | Disposition: A | Discharge status: Home / Self Care | Payer: PRIVATE HEALTH INSURANCE | Attending: Emergency Medicine

## 2016-05-17 ENCOUNTER — Emergency Department: Admit: 2016-05-17 | Payer: PRIVATE HEALTH INSURANCE

## 2016-05-17 DIAGNOSIS — R0789 Other chest pain: Secondary | ICD-10-CM

## 2016-05-17 LAB — EKG 12-LEAD
Atrial Rate: 72 {beats}/min
Diagnosis: NORMAL
P Axis: 63 degrees
P-R Interval: 138 ms
Q-T Interval: 374 ms
QRS Duration: 86 ms
QTc Calculation (Bazett): 409 ms
R Axis: 39 degrees
T Axis: 54 degrees
Ventricular Rate: 72 {beats}/min

## 2016-05-17 LAB — CBC WITH AUTOMATED DIFF
ABS. BASOPHILS: 0 10*3/uL (ref 0.0–0.1)
ABS. EOSINOPHILS: 0.2 10*3/uL (ref 0.0–0.4)
ABS. IMM. GRANS.: 0 10*3/uL (ref 0.00–0.04)
ABS. LYMPHOCYTES: 1.9 10*3/uL (ref 0.8–3.5)
ABS. MONOCYTES: 0.9 10*3/uL (ref 0.0–1.0)
ABS. NEUTROPHILS: 3 10*3/uL (ref 1.8–8.0)
ABSOLUTE NRBC: 0 10*3/uL (ref 0.00–0.01)
BASOPHILS: 1 % (ref 0–1)
EOSINOPHILS: 3 % (ref 0–7)
HCT: 44.8 % (ref 36.6–50.3)
HGB: 15.3 g/dL (ref 12.1–17.0)
IMMATURE GRANULOCYTES: 1 % — ABNORMAL HIGH (ref 0.0–0.5)
LYMPHOCYTES: 31 % (ref 12–49)
MCH: 32.4 PG (ref 26.0–34.0)
MCHC: 34.2 g/dL (ref 30.0–36.5)
MCV: 94.9 FL (ref 80.0–99.0)
MONOCYTES: 15 % — ABNORMAL HIGH (ref 5–13)
MPV: 9.9 FL (ref 8.9–12.9)
NEUTROPHILS: 49 % (ref 32–75)
NRBC: 0 PER 100 WBC
PLATELET: 224 10*3/uL (ref 150–400)
RBC: 4.72 M/uL (ref 4.10–5.70)
RDW: 13.3 % (ref 11.5–14.5)
WBC: 6.1 10*3/uL (ref 4.1–11.1)

## 2016-05-17 LAB — METABOLIC PANEL, COMPREHENSIVE
A-G Ratio: 0.8 — ABNORMAL LOW (ref 1.1–2.2)
ALT (SGPT): 20 U/L (ref 12–78)
AST (SGOT): 14 U/L — ABNORMAL LOW (ref 15–37)
Albumin: 3.2 g/dL — ABNORMAL LOW (ref 3.5–5.0)
Alk. phosphatase: 50 U/L (ref 45–117)
Anion gap: 8 mmol/L (ref 5–15)
BUN/Creatinine ratio: 9 — ABNORMAL LOW (ref 12–20)
BUN: 7 MG/DL (ref 6–20)
Bilirubin, total: 0.3 MG/DL (ref 0.2–1.0)
CO2: 25 mmol/L (ref 21–32)
Calcium: 8.2 MG/DL — ABNORMAL LOW (ref 8.5–10.1)
Chloride: 109 mmol/L — ABNORMAL HIGH (ref 97–108)
Creatinine: 0.79 MG/DL (ref 0.70–1.30)
GFR est AA: >60 mL/min/{1.73_m2} (ref >60)
GFR est non-AA: >60 mL/min/{1.73_m2} (ref >60)
Globulin: 3.8 g/dL (ref 2.0–4.0)
Glucose: 105 mg/dL — ABNORMAL HIGH (ref 65–100)
Potassium: 3.4 mmol/L — ABNORMAL LOW (ref 3.5–5.1)
Protein, total: 7 g/dL (ref 6.4–8.2)
Sodium: 142 mmol/L (ref 136–145)

## 2016-05-17 LAB — EKG, 12 LEAD, INITIAL
Atrial Rate: 72 {beats}/min
Calculated P Axis: 63 degrees
Calculated R Axis: 39 degrees
Calculated T Axis: 54 degrees
Diagnosis: NORMAL
P-R Interval: 138 ms
Q-T Interval: 374 ms
QRS Duration: 86 ms
QTC Calculation (Bezet): 409 ms
Ventricular Rate: 72 {beats}/min

## 2016-05-17 LAB — TROPONIN I
Troponin-I, Qt.: <0.04 ng/mL (ref <0.05)
Troponin-I, Qt.: <0.04 ng/mL (ref <0.05)

## 2016-05-17 LAB — D DIMER: D-dimer: 0.18 mg/L FEU (ref 0.00–0.65)

## 2016-05-17 LAB — D-DIMER, QUANTITATIVE: D-Dimer, Quant: 0.18 mg/L FEU (ref 0.00–0.65)

## 2016-05-17 MED ORDER — ONDANSETRON (PF) 4 MG/2 ML INJECTION
4 mg/2 mL | INTRAMUSCULAR | Status: AC
Start: 2016-05-17 — End: 2016-05-17
  Administered 2016-05-17: 11:00:00 via INTRAVENOUS

## 2016-05-17 MED ORDER — IOPAMIDOL 76 % IV SOLN
370 mg iodine /mL (76 %) | Freq: Once | INTRAVENOUS | Status: AC
Start: 2016-05-17 — End: 2016-05-17
  Administered 2016-05-17: 11:00:00 via INTRAVENOUS

## 2016-05-17 MED ORDER — SODIUM CHLORIDE 0.9 % IJ SYRG
Freq: Once | INTRAMUSCULAR | Status: AC
Start: 2016-05-17 — End: 2016-05-17
  Administered 2016-05-17: 11:00:00 via INTRAVENOUS

## 2016-05-17 MED ORDER — HYDROCODONE-ACETAMINOPHEN 7.5 MG-325 MG TAB
ORAL_TABLET | Freq: Four times a day (QID) | ORAL | 0 refills | Status: AC | PRN
Start: 2016-05-17 — End: ?

## 2016-05-17 MED ORDER — MORPHINE 2 MG/ML INJECTION
2 mg/mL | INTRAMUSCULAR | Status: AC
Start: 2016-05-17 — End: 2016-05-17
  Administered 2016-05-17: 11:00:00 via INTRAVENOUS

## 2016-05-17 MED ORDER — ASPIRIN 325 MG TAB
325 mg | Freq: Once | ORAL | Status: AC
Start: 2016-05-17 — End: 2016-05-17
  Administered 2016-05-17: 10:00:00 via ORAL

## 2016-05-17 MED ORDER — SODIUM CHLORIDE 0.9 % IV
Freq: Once | INTRAVENOUS | Status: AC
Start: 2016-05-17 — End: 2016-05-17
  Administered 2016-05-17: 11:00:00 via INTRAVENOUS

## 2016-05-17 MED ORDER — PREDNISONE 20 MG TAB
20 mg | ORAL_TABLET | Freq: Every day | ORAL | 0 refills | Status: AC
Start: 2016-05-17 — End: 2016-05-22

## 2016-05-17 MED ORDER — LIDOCAINE 2 % MUCOSAL SOLN
2 % | Status: AC
Start: 2016-05-17 — End: 2016-05-17
  Administered 2016-05-17: 11:00:00 via ORAL

## 2016-05-17 MED FILL — MAG-AL PLUS 200 MG-200 MG-20 MG/5 ML ORAL SUSPENSION: 200-200-20 mg/5 mL | ORAL | Qty: 30

## 2016-05-17 MED FILL — SODIUM CHLORIDE 0.9 % IV: INTRAVENOUS | Qty: 1000

## 2016-05-17 MED FILL — ISOVUE-370  76 % INTRAVENOUS SOLUTION: 370 mg iodine /mL (76 %) | INTRAVENOUS | Qty: 100

## 2016-05-17 MED FILL — BD POSIFLUSH NORMAL SALINE 0.9 % INJECTION SYRINGE: INTRAMUSCULAR | Qty: 10

## 2016-05-17 MED FILL — MORPHINE 2 MG/ML INJECTION: 2 mg/mL | INTRAMUSCULAR | Qty: 2

## 2016-05-17 MED FILL — ONDANSETRON (PF) 4 MG/2 ML INJECTION: 4 mg/2 mL | INTRAMUSCULAR | Qty: 2

## 2016-05-17 MED FILL — ASPIRIN 325 MG TAB: 325 mg | ORAL | Qty: 1

## 2016-05-17 NOTE — ED Triage Notes (Signed)
Pt woke up from sleep with chest pain./

## 2016-05-17 NOTE — ED Notes (Signed)
Report from Wole to Alexus

## 2016-05-17 NOTE — ED Notes (Signed)
Bedside and Verbal shift change report given to Alexus RN (oncoming nurse) by Koren BoundWally RN (offgoing nurse). Report included the following information SBAR, Kardex, ED Summary, Intake/Output, MAR, Recent Results and Med Rec Status.

## 2016-05-17 NOTE — ED Notes (Signed)
Dr. Hughes reviewed discharge instructions with the patient.  The patient verbalized understanding.  All questions and concerns were addressed.  The patient declined a wheelchair and is discharged ambulatory in the care of family members with instructions and prescriptions in hand.  Pt is alert and oriented x 4.  Respirations are clear and unlabored.

## 2016-05-17 NOTE — ED Provider Notes (Signed)
EMERGENCY DEPARTMENT HISTORY AND PHYSICAL EXAM      Date: 05/17/2016  Lance Sanders Name: Lance Sanders    History of Presenting Illness     Chief Complaint   Lance Sanders presents with   ??? Chest Pain     Lance Sanders complain of left sided chest pain and left arm pain that woke him up from sleep this morning       History Provided By: Lance Sanders    HPI: Lance Sanders, 51 y.o. male, presents ambulatory to the ED with cc of constant stabbing, left-sided chest pain x1 hour PTA upon waking this morning. Pt reports associated mild shortness of breath and left arm tinging. Pt states his pain is exacerbated when taking deep breaths. He reports a cough at baseline. He denies taking any medications to modify his sxs. Pt denies any recent long distance travel or recent heavy lifting/exercises. Pt specifically denies any fever, chills, cough, congestion, leg pain/swelling, abdominal pain, nausea, vomiting, diarrhea, dysuria, or urinary frequency.    PMHx: Significant for none  PSHx: Significant for Orthopedic   Social Hx: +tobacco (8.4ZYS), -EtOH, -Illicit Drugs     PCP: None    There are no other complaints, changes, or physical findings at this time.    Current Outpatient Prescriptions   Medication Sig Dispense Refill   ??? HYDROcodone-acetaminophen (NORCO) 7.5-325 mg per tablet Take 1 Tab by mouth every six (6) hours as needed for Pain. Max Daily Amount: 4 Tabs. 20 Tab 0   ??? predniSONE (DELTASONE) 20 mg tablet Take 2 Tabs by mouth daily for 5 days. With Breakfast 10 Tab 0       Past History     Past Medical History:  History reviewed. No pertinent past medical history.    Past Surgical History:  Past Surgical History:   Procedure Laterality Date   ??? HX ORTHOPAEDIC      bilateral knees       Family History:  History reviewed. No pertinent family history.    Social History:  Social History   Substance Use Topics   ??? Smoking status: Current Every Day Smoker     Packs/day: 1.00   ??? Smokeless tobacco: Never Used   ??? Alcohol use Yes       Comment: 6/week       Allergies:  Allergies   Allergen Reactions   ??? Sulfa (Sulfonamide Antibiotics) Unknown (comments)         Review of Systems   Review of Systems   Constitutional: Negative for activity change, chills and fever.   HENT: Negative for congestion and sore throat.    Eyes: Positive for redness. Negative for pain.   Respiratory: Positive for cough and shortness of breath. Negative for chest tightness.    Cardiovascular: Positive for chest pain. Negative for palpitations and leg swelling.   Gastrointestinal: Negative for abdominal pain, diarrhea, nausea and vomiting.   Genitourinary: Negative for dysuria, frequency and urgency.   Musculoskeletal: Negative for back pain and neck pain.   Skin: Negative for rash.   Neurological: Negative for syncope, light-headedness and headaches.        Left arm tinging    Psychiatric/Behavioral: Negative for confusion.   All other systems reviewed and are negative.      Physical Exam   Physical Exam   Constitutional: He is oriented to person, place, and time. He appears well-developed and well-nourished. No distress.   HENT:   Head: Normocephalic.   Nose: Nose normal.   Mouth/Throat:  Oropharynx is clear and moist. No oropharyngeal exudate.   Eyes: Conjunctivae are normal. Pupils are equal, round, and reactive to light. No scleral icterus.   Neck: Normal range of motion. Neck supple. No JVD present. No tracheal deviation present. No thyromegaly present.   Cardiovascular: Normal rate, regular rhythm and intact distal pulses.  Exam reveals no gallop and no friction rub.    No murmur heard.  Pulmonary/Chest: Effort normal and breath sounds normal. No stridor. No respiratory distress. He has no wheezes. He has no rales.   Abdominal: Soft. Bowel sounds are normal. He exhibits no distension. There is no tenderness. There is no rebound and no guarding.   Musculoskeletal: Normal range of motion. He exhibits no edema.   Lymphadenopathy:     He has no cervical adenopathy.    Neurological: He is alert and oriented to person, place, and time. No cranial nerve deficit. He exhibits normal muscle tone. Coordination normal.   Skin: Skin is warm and dry. No rash noted. He is not diaphoretic. No erythema.   Psychiatric: He has a normal mood and affect. His behavior is normal.   Nursing note and vitals reviewed.        Diagnostic Study Results     Labs -     Recent Results (from the past 12 hour(s))   EKG, 12 LEAD, INITIAL    Collection Time: 05/17/16  4:06 AM   Result Value Ref Range    Ventricular Rate 72 BPM    Atrial Rate 72 BPM    P-R Interval 138 ms    QRS Duration 86 ms    Q-T Interval 374 ms    QTC Calculation (Bezet) 409 ms    Calculated P Axis 63 degrees    Calculated R Axis 39 degrees    Calculated T Axis 54 degrees    Diagnosis       Normal sinus rhythm  Normal ECG  No previous ECGs available     CBC WITH AUTOMATED DIFF    Collection Time: 05/17/16  4:16 AM   Result Value Ref Range    WBC 6.1 4.1 - 11.1 K/uL    RBC 4.72 4.10 - 5.70 M/uL    HGB 15.3 12.1 - 17.0 g/dL    HCT 44.8 36.6 - 50.3 %    MCV 94.9 80.0 - 99.0 FL    MCH 32.4 26.0 - 34.0 PG    MCHC 34.2 30.0 - 36.5 g/dL    RDW 13.3 11.5 - 14.5 %    PLATELET 224 150 - 400 K/uL    MPV 9.9 8.9 - 12.9 FL    NRBC 0.0 0 PER 100 WBC    ABSOLUTE NRBC 0.00 0.00 - 0.01 K/uL    NEUTROPHILS 49 32 - 75 %    LYMPHOCYTES 31 12 - 49 %    MONOCYTES 15 (H) 5 - 13 %    EOSINOPHILS 3 0 - 7 %    BASOPHILS 1 0 - 1 %    IMMATURE GRANULOCYTES 1 (H) 0.0 - 0.5 %    ABS. NEUTROPHILS 3.0 1.8 - 8.0 K/UL    ABS. LYMPHOCYTES 1.9 0.8 - 3.5 K/UL    ABS. MONOCYTES 0.9 0.0 - 1.0 K/UL    ABS. EOSINOPHILS 0.2 0.0 - 0.4 K/UL    ABS. BASOPHILS 0.0 0.0 - 0.1 K/UL    ABS. IMM. GRANS. 0.0 0.00 - 0.04 K/UL    DF AUTOMATED     METABOLIC PANEL, COMPREHENSIVE  Collection Time: 05/17/16  4:16 AM   Result Value Ref Range    Sodium 142 136 - 145 mmol/L    Potassium 3.4 (L) 3.5 - 5.1 mmol/L    Chloride 109 (H) 97 - 108 mmol/L    CO2 25 21 - 32 mmol/L     Anion gap 8 5 - 15 mmol/L    Glucose 105 (H) 65 - 100 mg/dL    BUN 7 6 - 20 MG/DL    Creatinine 0.79 0.70 - 1.30 MG/DL    BUN/Creatinine ratio 9 (L) 12 - 20      GFR est AA >60 >60 ml/min/1.55m    GFR est non-AA >60 >60 ml/min/1.724m   Calcium 8.2 (L) 8.5 - 10.1 MG/DL    Bilirubin, total 0.3 0.2 - 1.0 MG/DL    ALT (SGPT) 20 12 - 78 U/L    AST (SGOT) 14 (L) 15 - 37 U/L    Alk. phosphatase 50 45 - 117 U/L    Protein, total 7.0 6.4 - 8.2 g/dL    Albumin 3.2 (L) 3.5 - 5.0 g/dL    Globulin 3.8 2.0 - 4.0 g/dL    A-G Ratio 0.8 (L) 1.1 - 2.2     TROPONIN I    Collection Time: 05/17/16  4:16 AM   Result Value Ref Range    Troponin-I, Qt. <0.04 <0.05 ng/mL   D DIMER    Collection Time: 05/17/16  4:16 AM   Result Value Ref Range    D-dimer 0.18 0.00 - 0.65 mg/L FEU   TROPONIN I    Collection Time: 05/17/16  6:42 AM   Result Value Ref Range    Troponin-I, Qt. <0.04 <0.05 ng/mL       Radiologic Studies -     CXR Results  (Last 48 hours)               05/17/16 0505  XR CHEST PA LAT Final result    Impression:  IMPRESSION:    Clear lungs.       Narrative:  PA AND LATERAL CHEST RADIOGRAPHS: 05/17/2016 5:05 AM       INDICATION: Awoke from sleep with left-sided chest pain and left arm pain.       COMPARISON: None.       TECHNIQUE: Frontal and lateral radiographs of the chest.       FINDINGS:   The lungs are clear. The central airways are patent. The heart size is normal.   No pneumothorax or pleural effusion. There is left costophrenic angle blunting   on the frontal radiograph with no pleural effusion on the lateral radiograph.                   Medical Decision Making   I am the first provider for this Lance Sanders.    I reviewed the vital signs, available nursing notes, past medical history, past surgical history, family history and social history.    Vital Signs-Reviewed the Lance Sanders's vital signs.  Lance Sanders Vitals for the past 12 hrs:   Temp Pulse Resp BP SpO2   05/17/16 0730 - 82 20 150/40 97 %   05/17/16 0700 - 77 23 139/87 97 %    05/17/16 0630 - 74 19 148/90 98 %   05/17/16 0600 - 76 17 (!) 142/93 94 %   05/17/16 0530 - 70 15 130/86 97 %   05/17/16 0507 - 67 15 - 99 %   05/17/16 0505 - - - (!)  138/91 -   05/17/16 0500 98.2 ??F (36.8 ??C) 71 15 128/83 99 %   05/17/16 0500 - 69 15 128/83 98 %   05/17/16 0430 - 77 18 (!) 150/93 97 %   05/17/16 0424 - 85 18 - 99 %   05/17/16 0418 - - - - 98 %   05/17/16 0417 - 82 17 - 97 %   05/17/16 0416 - 88 12 (!) 151/100 -   05/17/16 0415 98.4 ??F (36.9 ??C) - - - -   05/17/16 8657 - - - (!) 140/106 -       Pulse Oximetry Analysis - 98% on RA    Cardiac Monitor:   Rate: 77 bpm  Rhythm: Normal Sinus Rhythm      ED EKG interpretation: 04:06  Rhythm: normal sinus rhythm; and regular . Rate (approx.): 72; Axis: normal; PR Interval: normal; QRS interval: normal ; ST/T wave: normal; normal ekg; This EKG was interpreted by Allen Derry MD,ED Provider.      Records Reviewed: Nursing Notes, Old Medical Records and Previous electrocardiograms    Provider Notes (Medical Decision Making):   DDx: ACS, PNA    ED Course:   Initial assessment performed. The patients presenting problems have been discussed, and they are in agreement with the care plan formulated and outlined with them.  I have encouraged them to ask questions as they arise throughout their visit.    TOBACCO COUNSELING:  Upon evaluation, pt expressed that they are a current tobacco user. Pt has been counseled on the dangers of smoking and was encouraged to quit as soon as possible in order to decrease further risks to their health. Pt has conveyed their understanding of the risks involved should they continue to use tobacco products.    PROGRESS NOTE:  5:52 AM  Pt reevaluated. Pt updated on resulted labs and CXR, and current plan of care. Pt expressed understanding of current plan, including secondary troponin.  Written by Quenton Fetter, ED Scribe, as dictated by Allen Derry, MD.    SIGN OUT:  6:06 AM   Lance Sanders's presentation, labs/imaging and plan of care was reviewed with Rada Hay, DO as part of sign out.  They will review pt's secondary Troponin at 0700 as part of the plan discussed with the Lance Sanders.    Rada Hay, DO's assistance in completion of this plan is greatly appreciated but it should be noted that I will be the provider of record for this Lance Sanders.  Allen Derry, MD    7:48 AM  Pt's repeat troponin negative.     8:16 AM  Pt re-evaluated. Pt reports that he is feeling better. Discussed results of all labs and imaging. Pt endorses understanding with plan to discharge home.     8:21 AM  I have reviewed discharge instructions with the Lance Sanders and explained medications that he is being discharged with. The Lance Sanders verbalized understanding and agrees with plan.    Disposition:  Discharge Note:  8:21 AM  The pt is ready for discharge. The pt's signs, symptoms, diagnosis, and discharge instructions have been discussed and pt has conveyed their understanding. The pt is to follow up as recommended or return to ER should their symptoms worsen. Plan has been discussed and pt is in agreement.      PLAN:  1.   Current Discharge Medication List      START taking these medications    Details   predniSONE (DELTASONE) 20 mg tablet Take 2 Tabs by  mouth daily for 5 days. With Breakfast  Qty: 10 Tab, Refills: 0         CONTINUE these medications which have CHANGED    Details   HYDROcodone-acetaminophen (NORCO) 7.5-325 mg per tablet Take 1 Tab by mouth every six (6) hours as needed for Pain. Max Daily Amount: 4 Tabs.  Qty: 20 Tab, Refills: 0    Associated Diagnoses: Atypical chest pain         STOP taking these medications       ofloxacin (FLOXIN) 0.3 % ophthalmic solution Comments:   Reason for Stopping:             2.   Follow-up Information     Follow up With Details Comments Contact Info    August Saucer, MD Schedule an appointment as soon as possible for a visit in 1 day  [address]  [address]   [place] [address]  [phone]      Mohammad Soh[address], [place]  [place] [address]  [phone]          Return to ED if worse     Diagnosis     Clinical Impression:   1. Atypical chest pain        Attestations:    This note is prepared by Quenton Fetter, acting as Scribe for Allen Derry, MD      The scribe's documentation has been prepared under my direction and personally reviewed by me in its entirety. I confirm that the note above accurately reflects all work, treatment, procedures, and medical decision making performed by me.  Allen Derry, MD        This note will not be viewable in [facility].

## 2019-12-21 ENCOUNTER — Other Ambulatory Visit: Payer: Self-pay

## 2019-12-21 ENCOUNTER — Ambulatory Visit
Admission: EM | Admit: 2019-12-21 | Discharge: 2019-12-21 | Disposition: A | Discharge status: Home / Self Care | Payer: No Typology Code available for payment source | Attending: Emergency Medicine | Admitting: Emergency Medicine

## 2019-12-21 ENCOUNTER — Encounter: Payer: Self-pay | Admitting: Emergency Medicine

## 2019-12-21 DIAGNOSIS — R112 Nausea with vomiting, unspecified: Secondary | ICD-10-CM | POA: Diagnosis not present

## 2019-12-21 DIAGNOSIS — Z1152 Encounter for screening for COVID-19: Secondary | ICD-10-CM

## 2019-12-21 DIAGNOSIS — R5383 Other fatigue: Secondary | ICD-10-CM

## 2019-12-21 MED ORDER — PREDNISONE 10 MG PO TABS: 20.0000 mg | ORAL_TABLET | Freq: Every day | ORAL | 0 refills | Status: DC

## 2019-12-21 MED ORDER — ONDANSETRON 4 MG PO TBDP: 4.0000 mg | ORAL_TABLET | Freq: Three times a day (TID) | ORAL | 0 refills | Status: DC | PRN

## 2019-12-21 MED ORDER — BENZONATATE 100 MG PO CAPS: 100.0000 mg | ORAL_CAPSULE | Freq: Three times a day (TID) | ORAL | 0 refills | Status: DC

## 2019-12-21 NOTE — ED Triage Notes (Signed)
Patient c/o fatigue, headache, upset stomach, nausea x 1 day

## 2019-12-21 NOTE — Discharge Instructions (Addendum)
  COVID testing ordered.  It will take between 2-7 days for test results.  Someone will contact you regarding abnormal results.    In the meantime: You should remain isolated in your home for 10 days from symptom onset AND greater than 24 hours after symptoms resolution (absence of fever without the use of fever-reducing medication and improvement in respiratory symptoms), whichever is longer Get plenty of rest and push fluids Tessalon Perles prescribed for cough Prednisone as prescribed Zofran was prescribed for nausea Use medications daily for symptom relief Use OTC medications like ibuprofen or tylenol as needed fever or pain Call or go to the ED if you have any new or worsening symptoms such as fever, worsening cough, shortness of breath, chest tightness, chest pain, turning blue, changes in mental status, etc..Marland Kitchen

## 2019-12-21 NOTE — ED Provider Notes (Signed)
[place]   [id number] 12/21/19 Arrival Time: 4562  CC: COVID Symptoms   SUBJECTIVE: History from: patient.  [name] is a 54 y.o. male who presented to the urgent care for complaint of fatigue, headache, upset stomach and nausea for the past 1 day.  Denies sick exposure to COVID, flu or strep.  Denies recent travel.  Has tried OTC medication without relief.  Denies aggravating factors.  Denies previous symptoms in the past.   Denies fever, chills, fatigue, sinus pain, rhinorrhea, sore throat, SOB, wheezing, chest pain, nausea, changes in bowel or bladder habits.     ROS: As per HPI.  All other pertinent ROS negative.     History reviewed. No pertinent past medical history. Past Surgical History:  Procedure Laterality Date  . LUNG SURGERY     Allergies  Allergen Reactions  . Sulfonamide Derivatives    No current facility-administered medications on file prior to encounter.   No current outpatient medications on file prior to encounter.   Social History   Socioeconomic History  . Marital status: Legally Separated    Spouse name: Not on file  . Number of children: Not on file  . Years of education: Not on file  . Highest education level: Not on file  Occupational History  . Not on file  Tobacco Use  . Smoking status: Current Every Day Smoker    Packs/day: 1.00    Types: Cigarettes  . Smokeless tobacco: Never Used  Substance and Sexual Activity  . Alcohol use: Never  . Drug use: Never  . Sexual activity: Not on file  Other Topics Concern  . Not on file  Social History Narrative  . Not on file   Social Determinants of Health   Financial Resource Strain:   . Difficulty of Paying Living Expenses: Not on file  Food Insecurity:   . Worried About Charity fundraiser in the Last Year: Not on file  . Ran Out of Food in the Last Year: Not on file  Transportation Needs:   . Lack of Transportation (Medical): Not on file  . Lack of Transportation  (Non-Medical): Not on file  Physical Activity:   . Days of Exercise per Week: Not on file  . Minutes of Exercise per Session: Not on file  Stress:   . Feeling of Stress : Not on file  Social Connections:   . Frequency of Communication with Friends and Family: Not on file  . Frequency of Social Gatherings with Friends and Family: Not on file  . Attends Religious Services: Not on file  . Active Member of Clubs or Organizations: Not on file  . Attends Archivist Meetings: Not on file  . Marital Status: Not on file  Intimate Partner Violence:   . Fear of Current or Ex-Partner: Not on file  . Emotionally Abused: Not on file  . Physically Abused: Not on file  . Sexually Abused: Not on file   No family history on file.  OBJECTIVE:  Vitals:   12/21/19 1541  BP: (!) 156/102  Pulse: 97  Resp: 15  Temp: 98.5 F (36.9 C)  TempSrc: Oral  SpO2: 96%     General appearance: alert; appears fatigued, but nontoxic; speaking in full sentences and tolerating own secretions HEENT: NCAT; Ears: EACs clear, TMs pearly gray; Eyes: PERRL.  EOM grossly intact. Sinuses: nontender; Nose: nares patent without rhinorrhea, Throat: oropharynx clear, tonsils non erythematous or enlarged, uvula midline  Neck: supple without LAD  Lungs: unlabored respirations, symmetrical air entry; cough: mild; no respiratory distress; CTAB Heart: regular rate and rhythm.  Radial pulses 2+ symmetrical bilaterally Skin: warm and dry Psychological: alert and cooperative; normal mood and affect  LABS:  No results found for this or any previous visit (from the past 24 hour(s)).   ASSESSMENT & PLAN:  1. Other fatigue   2. Non-intractable vomiting with nausea, unspecified vomiting type   3. Encounter for screening for COVID-19     Meds ordered this encounter  Medications  . benzonatate (TESSALON) 100 MG capsule    Sig: Take 1 capsule (100 mg total) by mouth every 8 (eight) hours.    Dispense:  30 capsule     Refill:  0  . predniSONE (DELTASONE) 10 MG tablet    Sig: Take 2 tablets (20 mg total) by mouth daily.    Dispense:  15 tablet    Refill:  0  . ondansetron (ZOFRAN ODT) 4 MG disintegrating tablet    Sig: Take 1 tablet (4 mg total) by mouth every 8 (eight) hours as needed for nausea or vomiting.    Dispense:  20 tablet    Refill:  0    Discharge Instructions  COVID testing ordered.  It will take between 2-7 days for test results.  Someone will contact you regarding abnormal results.    In the meantime: You should remain isolated in your home for 10 days from symptom onset AND greater than 24 hours after symptoms resolution (absence of fever without the use of fever-reducing medication and improvement in respiratory symptoms), whichever is longer Get plenty of rest and push fluids Tessalon Perles prescribed for cough Prednisone as prescribed Zofran was prescribed for nausea Use medications daily for symptom relief Use OTC medications like ibuprofen or tylenol as needed fever or pain Call or go to the ED if you have any new or worsening symptoms such as fever, worsening cough, shortness of breath, chest tightness, chest pain, turning blue, changes in mental status, etc...   Reviewed expectations re: course of current medical issues. Questions answered. Outlined signs and symptoms indicating need for more acute intervention. Patient verbalized understanding. After Visit Summary given.      Note: This document was prepared using Dragon voice recognition software and may include unintentional dictation errors.    Emerson Monte, FNP 12/21/19 1626

## 2019-12-23 ENCOUNTER — Emergency Department (HOSPITAL_COMMUNITY): Payer: PRIVATE HEALTH INSURANCE

## 2019-12-23 ENCOUNTER — Emergency Department (HOSPITAL_COMMUNITY)
Admission: EM | Admit: 2019-12-23 | Discharge: 2019-12-23 | Disposition: A | Discharge status: Home / Self Care | Payer: PRIVATE HEALTH INSURANCE | Attending: Emergency Medicine | Admitting: Emergency Medicine

## 2019-12-23 ENCOUNTER — Other Ambulatory Visit: Payer: Self-pay

## 2019-12-23 ENCOUNTER — Encounter (HOSPITAL_COMMUNITY): Payer: Self-pay | Admitting: Emergency Medicine

## 2019-12-23 DIAGNOSIS — R519 Headache, unspecified: Secondary | ICD-10-CM

## 2019-12-23 DIAGNOSIS — F17210 Nicotine dependence, cigarettes, uncomplicated: Secondary | ICD-10-CM | POA: Diagnosis not present

## 2019-12-23 DIAGNOSIS — I10 Essential (primary) hypertension: Secondary | ICD-10-CM | POA: Diagnosis not present

## 2019-12-23 DIAGNOSIS — R079 Chest pain, unspecified: Secondary | ICD-10-CM

## 2019-12-23 DIAGNOSIS — Z79899 Other long term (current) drug therapy: Secondary | ICD-10-CM | POA: Insufficient documentation

## 2019-12-23 LAB — COMPREHENSIVE METABOLIC PANEL
ALT: 33 U/L (ref 0–44)
AST: 37 U/L (ref 15–41)
Albumin: 4.3 g/dL (ref 3.5–5.0)
Alkaline Phosphatase: 49 U/L (ref 38–126)
Anion gap: 13 (ref 5–15)
BUN: 10 mg/dL (ref 6–20)
CO2: 24 mmol/L (ref 22–32)
Calcium: 9 mg/dL (ref 8.9–10.3)
Chloride: 96 mmol/L — ABNORMAL LOW (ref 98–111)
Creatinine, Ser: 0.93 mg/dL (ref 0.61–1.24)
GFR calc Af Amer: >60 mL/min (ref >60)
GFR calc non Af Amer: >60 mL/min (ref >60)
Glucose, Bld: 130 mg/dL — ABNORMAL HIGH (ref 70–99)
Potassium: 3.5 mmol/L (ref 3.5–5.1)
Sodium: 133 mmol/L — ABNORMAL LOW (ref 135–145)
Total Bilirubin: 0.7 mg/dL (ref 0.3–1.2)
Total Protein: 8.3 g/dL — ABNORMAL HIGH (ref 6.5–8.1)

## 2019-12-23 LAB — CBC WITH DIFFERENTIAL/PLATELET
Abs Immature Granulocytes: 0.05 10*3/uL (ref 0.00–0.07)
Basophils Absolute: 0.1 10*3/uL (ref 0.0–0.1)
Basophils Relative: 1 %
Eosinophils Absolute: 0 10*3/uL (ref 0.0–0.5)
Eosinophils Relative: 0 %
HCT: 49.1 % (ref 39.0–52.0)
Hemoglobin: 16.8 g/dL (ref 13.0–17.0)
Immature Granulocytes: 1 %
Lymphocytes Relative: 22 %
Lymphs Abs: 2.1 10*3/uL (ref 0.7–4.0)
MCH: 33.3 pg (ref 26.0–34.0)
MCHC: 34.2 g/dL (ref 30.0–36.0)
MCV: 97.4 fL (ref 80.0–100.0)
Monocytes Absolute: 1 10*3/uL (ref 0.1–1.0)
Monocytes Relative: 11 %
Neutro Abs: 6.3 10*3/uL (ref 1.7–7.7)
Neutrophils Relative %: 65 %
Platelets: 254 10*3/uL (ref 150–400)
RBC: 5.04 MIL/uL (ref 4.22–5.81)
RDW: 12.5 % (ref 11.5–15.5)
WBC: 9.5 10*3/uL (ref 4.0–10.5)
nRBC: 0 % (ref 0.0–0.2)

## 2019-12-23 LAB — TROPONIN I (HIGH SENSITIVITY): Troponin I (High Sensitivity): 3 ng/L (ref <18)

## 2019-12-23 MED ORDER — FAMOTIDINE IN NACL 20-0.9 MG/50ML-% IV SOLN: 20.0000 mg | Freq: Once | INTRAVENOUS | Status: DC

## 2019-12-23 MED ORDER — ALUM & MAG HYDROXIDE-SIMETH 200-200-20 MG/5ML PO SUSP: 30.0000 mL | Freq: Once | ORAL | Status: DC

## 2019-12-23 MED ORDER — LACTATED RINGERS IV BOLUS: 1000.0000 mL | Freq: Once | INTRAVENOUS | Status: DC

## 2019-12-23 MED ORDER — LIDOCAINE VISCOUS HCL 2 % MT SOLN: 15.0000 mL | Freq: Once | OROMUCOSAL | Status: DC

## 2019-12-23 MED ORDER — HYDROMORPHONE HCL 1 MG/ML IJ SOLN: 1.0000 mg | Freq: Once | INTRAMUSCULAR | Status: DC

## 2019-12-23 MED ORDER — IOHEXOL 350 MG/ML SOLN
75.0000 mL | Freq: Once | INTRAVENOUS | Status: AC | PRN
  Administered 2019-12-23: 75 mL via INTRAVENOUS

## 2019-12-23 NOTE — Discharge Instructions (Addendum)
Your blood pressure was high today. This doesn't necessarily mean you have hypertension though. You need to establish a primary care provider. If your blood pressure remains consistently elevated then you will need to be started on medications but it is unnecessary at this time.

## 2019-12-23 NOTE — ED Triage Notes (Signed)
Pt reports he went to [facility] on Friday for c/o nausea and dizziness.  Reports nausea subsided and started having chest pain during the night.  Reports pain woke him up.   Reports pain in left chest and describes as sharp.

## 2019-12-23 NOTE — ED Provider Notes (Signed)
[facility] EMERGENCY DEPARTMENT Provider Note   CSN: [id number] Arrival date & time: 12/23/19  4403     History Chief Complaint  Patient presents with  . Chest Pain    [name] is a 54 y.o. male.  HPI   54 year old male with chest pain.  Symptoms left of his chest.  Worse with certain movements and deep breathing.  He does not feel short of breath though.  No fevers or chills.  Symptoms preceded by nausea and dizziness on Friday.  The symptoms resolved but began having the chest pain this morning.  No history of heart problems that he is aware of.  No unusual leg pain or swelling.  No past history of DVT/PE.  History reviewed. No pertinent past medical history.  Patient Active Problem List   Diagnosis Date Noted  . ARTHROSCOPY, RIGHT KNEE, HX OF 08/22/2006  . METHICILLIN RESISTANT STAPHYLOCOCCUS AUREUS INFECTION 07/25/2006  . EMPYEMA 07/25/2006    Past Surgical History:  Procedure Laterality Date  . LUNG SURGERY       No family history on file.  Social History   Tobacco Use  . Smoking status: Current Every Day Smoker    Packs/day: 1.00    Types: Cigarettes  . Smokeless tobacco: Never Used  Substance Use Topics  . Alcohol use: Never  . Drug use: Never    Home Medications Prior to Admission medications   Medication Sig Start Date End Date Taking? Authorizing Provider  benzonatate (TESSALON) 100 MG capsule Take 1 capsule (100 mg total) by mouth every 8 (eight) hours. 12/21/19   Avegno, Darrelyn Hillock, FNP  ondansetron (ZOFRAN ODT) 4 MG disintegrating tablet Take 1 tablet (4 mg total) by mouth every 8 (eight) hours as needed for nausea or vomiting. 12/21/19   Avegno, Darrelyn Hillock, FNP  predniSONE (DELTASONE) 10 MG tablet Take 2 tablets (20 mg total) by mouth daily. 12/21/19   AvegnoDarrelyn Hillock, FNP    Allergies    Sulfonamide derivatives  Review of Systems   Review of Systems All systems reviewed and negative, other than as noted in HPI.  Physical  Exam Updated Vital Signs BP (!) 156/101 (BP Location: Right Arm)   Pulse (!) 108   Temp 98.6 F (37 C) (Oral)   Ht 5\' 10"  (1.778 m)   Wt 74.8 kg   SpO2 96%   BMI 23.68 kg/m   Physical Exam Vitals and nursing note reviewed.  Constitutional:      General: He is not in acute distress.    Appearance: He is well-developed.  HENT:     Head: Normocephalic and atraumatic.  Eyes:     General:        Right eye: No discharge.        Left eye: No discharge.     Conjunctiva/sclera: Conjunctivae normal.  Cardiovascular:     Rate and Rhythm: Normal rate and regular rhythm.     Heart sounds: Normal heart sounds. No murmur heard.  No friction rub. No gallop.   Pulmonary:     Effort: Pulmonary effort is normal. No respiratory distress.     Breath sounds: Normal breath sounds.  Abdominal:     General: There is no distension.     Palpations: Abdomen is soft.     Tenderness: There is no abdominal tenderness.  Musculoskeletal:        General: No tenderness.     Cervical back: Neck supple.  Skin:    General: Skin is warm  and dry.  Neurological:     Mental Status: He is alert.  Psychiatric:        Behavior: Behavior normal.        Thought Content: Thought content normal.     ED Results / Procedures / Treatments   Labs (all labs ordered are listed, but only abnormal results are displayed) Labs Reviewed  COMPREHENSIVE METABOLIC PANEL - Abnormal; Notable for the following components:      Result Value   Sodium 133 (*)    Chloride 96 (*)    Glucose, Bld 130 (*)    Total Protein 8.3 (*)    All other components within normal limits  CBC WITH DIFFERENTIAL/PLATELET  TROPONIN I (HIGH SENSITIVITY)    EKG EKG Interpretation  Date/Time:  Sunday December 23 2019 07:40:19 EDT Ventricular Rate:  103 PR Interval:  136 QRS Duration: 82 QT Interval:  342 QTC Calculation: 448 R Axis:   82 Text Interpretation: Sinus tachycardia Otherwise normal ECG Confirmed by Virgel Manifold [phone]) on  12/23/2019 12:30:51 PM   Radiology DG Chest 2 View  Result Date: 12/23/2019 CLINICAL DATA:  Chest pain EXAM: CHEST - 2 VIEW COMPARISON:  Prior chest x-ray 07/24/2009 FINDINGS: Surgical changes in the left lower lobe consistent with prior wedge resection. However, there is a new (compared to prior imaging from 2011) opacity in the posterior and lateral aspect of the left lung base. Otherwise, the lungs are clear save for some mild chronic bronchitic changes. The cardiac and mediastinal contours are within normal limits. No pneumothorax. No acute osseous abnormality. Remote healed left mid clavicular fracture. IMPRESSION: 1. Compared to prior imaging from 2011, new opacity in the posterolateral left lower lobe. Differential considerations include loculated pneumothorax with atelectasis versus infiltrate. Electronically Signed   By: Jacqulynn Cadet M.D.   On: 12/23/2019 08:31   CT Angio Chest PE W and/or Wo Contrast  Result Date: 12/23/2019 CLINICAL DATA:  Left-sided chest pain, high probability for PE EXAM: CT ANGIOGRAPHY CHEST WITH CONTRAST TECHNIQUE: Multidetector CT imaging of the chest was performed using the standard protocol during bolus administration of intravenous contrast. Multiplanar CT image reconstructions and MIPs were obtained to evaluate the vascular anatomy. CONTRAST:  68mL OMNIPAQUE IOHEXOL 350 MG/ML SOLN COMPARISON:  Prior CT a chest 12/29/2008 FINDINGS: Cardiovascular: Satisfactory opacification of the pulmonary arteries to the segmental level. No evidence of pulmonary embolism. Normal heart size. No pericardial effusion. Trace calcification noted along the coronary arteries. Mediastinum/Nodes: Unremarkable CT appearance of the thyroid gland. No suspicious mediastinal or hilar adenopathy. No soft tissue mediastinal mass. The thoracic esophagus is unremarkable. Lungs/Pleura: Lungs are clear. No pleural effusion or pneumothorax. Upper Abdomen: Visualized upper abdominal organs are  unremarkable. Musculoskeletal: No chest wall abnormality. No acute or significant osseous findings. Review of the MIP images confirms the above findings. IMPRESSION: 1. Negative for acute pulmonary embolism, pneumonia or other acute cardiopulmonary process. 2. Trace atherosclerotic calcifications along the coronary arteries. Electronically Signed   By: Jacqulynn Cadet M.D.   On: 12/23/2019 13:55    Procedures Procedures (including critical care time)  Medications Ordered in ED Medications - No data to display  ED Course  I have reviewed the triage vital signs and the nursing notes.  Pertinent labs & imaging results that were available during my care of the patient were reviewed by me and considered in my medical decision making (see chart for details).    MDM Rules/Calculators/A&P  54 year old male with pleuritic sounding chest pain.  Afebrile.  CT without evidence of PE or other concerning pathology.  Atypical symptoms for ACS.  No signs of dissection.  Does not appear to be emergent process.  Plan as needed NSAIDs.  Return precautions discussed.  Outpatient follow-up otherwise.  Final Clinical Impression(s) / ED Diagnoses Final diagnoses:  Chest pain, unspecified type  Nonintractable headache, unspecified chronicity pattern, unspecified headache type  Hypertension, unspecified type    Rx / DC Orders ED Discharge Orders    None       Virgel Manifold, MD 12/24/19 Bosie Helper

## 2019-12-24 LAB — NOVEL CORONAVIRUS, NAA: SARS-CoV-2, NAA: NOT DETECTED

## 2020-02-07 ENCOUNTER — Ambulatory Visit (INDEPENDENT_AMBULATORY_CARE_PROVIDER_SITE_OTHER): Payer: PRIVATE HEALTH INSURANCE | Admitting: Gastroenterology

## 2020-03-10 ENCOUNTER — Ambulatory Visit (INDEPENDENT_AMBULATORY_CARE_PROVIDER_SITE_OTHER): Payer: No Typology Code available for payment source | Admitting: Gastroenterology

## 2020-03-10 ENCOUNTER — Telehealth (INDEPENDENT_AMBULATORY_CARE_PROVIDER_SITE_OTHER): Payer: Self-pay

## 2020-03-10 ENCOUNTER — Encounter (INDEPENDENT_AMBULATORY_CARE_PROVIDER_SITE_OTHER): Payer: Self-pay | Admitting: Gastroenterology

## 2020-03-10 ENCOUNTER — Encounter (INDEPENDENT_AMBULATORY_CARE_PROVIDER_SITE_OTHER): Payer: Self-pay

## 2020-03-10 ENCOUNTER — Other Ambulatory Visit: Payer: Self-pay

## 2020-03-10 DIAGNOSIS — K219 Gastro-esophageal reflux disease without esophagitis: Secondary | ICD-10-CM

## 2020-03-10 DIAGNOSIS — R1310 Dysphagia, unspecified: Secondary | ICD-10-CM | POA: Insufficient documentation

## 2020-03-10 DIAGNOSIS — R1319 Other dysphagia: Secondary | ICD-10-CM

## 2020-03-10 DIAGNOSIS — Z1211 Encounter for screening for malignant neoplasm of colon: Secondary | ICD-10-CM | POA: Insufficient documentation

## 2020-03-10 DIAGNOSIS — Z7189 Other specified counseling: Secondary | ICD-10-CM

## 2020-03-10 MED ORDER — OMEPRAZOLE 40 MG PO CPDR: 40.0000 mg | DELAYED_RELEASE_CAPSULE | Freq: Every day | ORAL | 1 refills | Status: AC

## 2020-03-10 MED ORDER — PLENVU 140 G PO SOLR: 280.0000 g | Freq: Once | ORAL | 0 refills | Status: AC

## 2020-03-10 NOTE — Patient Instructions (Addendum)
Schedule EGD with possible ED and colonoscopy Start omeprazole 40 mg qday Explained presumed etiology of reflux symptoms. Instruction provided in the use of antireflux medication - patient should take medication in the morning 30-45 minutes before eating breakfast. Discussed avoidance of eating within 2 hours of lying down to sleep and benefit of blocks to elevate head of bed.

## 2020-03-10 NOTE — Progress Notes (Signed)
[name], M.D. Gastroenterology & Hepatology [facility] [address] [place], [place] [address] Primary Care Physician: Patient, No Pcp Per No address on file  Referring MD: self  I will communicate my assessment and recommendations to the referring MD via EMR. Note: Occasional unusual wording and randomly placed punctuation marks may result from the use of speech recognition technology to transcribe this document"  Chief Complaint:  Dysphagia and CRC screening.  History of Present Illness: [name] is a 54 y.o. male with PMH GERD and emphysema, who presents for evaluation of dysphagia and CRC screening.  Patient reports that he has had progressive dysphagia for multiple years (at least 12 years). Patient states that dysphagia is worse with solid foods, but it is currently happening even with liquids. He reports that when the food is sitting in his chest he has to wait significant amount of time (up to 20 minutes) for it to go down. If he swallows liquids at that moment he reports feeling the fluid sits in his chest and eventually he has to vomit it. He states that he has some odynophagia occasionally when swallowing. He has changed his eating habits to eating small bites and takes time chewing his food.  He has not lost any significant amount of weight due to these as he has modified his lifestyle significantly. He also reports a chronic history of heartburn for close to the same amount of time he had presented dysphagia. He has tried Tums and Prilosec OTC for a few months without any improvement so he decided to stop taking the medication.  He had an episode of food impaction in 2009 that resolved spontaneously as the food went down to his stomach while he was in the ER, he has never required endoscopic intervention to remove food. Most of the times the food gets stuck he will need to induce vomiting.  The patient denies having  any nausea, vomiting, fever, chills, hematochezia, melena, hematemesis, abdominal distention, abdominal pain, diarrhea, jaundice, pruritus or weight loss.  Last EGD: never Last Colonoscopy: never  FHx: neg for any gastrointestinal/liver disease, no malignancies Social: alcohol very occasionally, smokes 1 pack a day for 40 years, neg illicit drug use Surgical: non contributory  Past Medical History: Past Medical History:  Diagnosis Date  . Acid reflux     Past Surgical History: Past Surgical History:  Procedure Laterality Date  . LUNG SURGERY      Family History: Family History  Adopted: Yes  Problem Relation Age of Onset  . Heart disease Mother     Social History: Social History   Tobacco Use  Smoking Status Current Every Day Smoker  . Packs/day: 1.00  . Types: Cigarettes  Smokeless Tobacco Never Used   Social History   Substance and Sexual Activity  Alcohol Use Yes   Comment: social   Social History   Substance and Sexual Activity  Drug Use Never    Allergies: Allergies  Allergen Reactions  . Sulfonamide Derivatives     Medications: No current outpatient medications on file.   No current facility-administered medications for this visit.    Review of Systems: GENERAL: negative for malaise, night sweats HEENT: No changes in hearing or vision, no nose bleeds or other nasal problems. NECK: Negative for lumps, goiter, pain and significant neck swelling RESPIRATORY: Negative for cough, wheezing CARDIOVASCULAR: Negative for chest pain, leg swelling, palpitations, orthopnea GI: SEE HPI MUSCULOSKELETAL: Negative for joint pain or swelling, back pain,  and muscle pain. SKIN: Negative for lesions, rash PSYCH: Negative for sleep disturbance, mood disorder and recent psychosocial stressors. HEMATOLOGY Negative for prolonged bleeding, bruising easily, and swollen nodes. ENDOCRINE: Negative for cold or heat intolerance, polyuria, polydipsia and goiter. NEURO:  negative for tremor, gait imbalance, syncope and seizures. The remainder of the review of systems is noncontributory.   Physical Exam: BP (!) 143/91 (BP Location: Left Arm, Patient Position: Sitting, Cuff Size: Large)   Pulse 93   Temp 98.2 F (36.8 C) (Oral)   Ht 5\' 9"  (1.753 m)   Wt 162 lb (73.5 kg)   BMI 23.92 kg/m  GENERAL: The patient is AO x3, in no acute distress. HEENT: Head is normocephalic and atraumatic. EOMI are intact. Mouth is well hydrated and without lesions. NECK: Supple. No masses LUNGS: Clear to auscultation. No presence of rhonchi/wheezing/rales. Adequate chest expansion HEART: RRR, normal s1 and s2. ABDOMEN: Soft, nontender, no guarding, no peritoneal signs, and nondistended. BS +. No masses. EXTREMITIES: Without any cyanosis, clubbing, rash, lesions or edema. NEUROLOGIC: AOx3, no focal motor deficit. SKIN: no jaundice, no rashes   Imaging/Labs: as above  I personally reviewed and interpreted the available labs, imaging and endoscopic files.  Impression and Plan: ADIB WAHBA is a 54 y.o. male with PMH GERD and emphysema, who presents for evaluation of dysphagia and CRC screening.  The patient has presented recurrent and worsening episodes of dysphagia for multiple years in the setting of previous history of heartburn symptoms, which I consider is related to GERD.  Due to this, will need to perform an endoscopic investigation with an EGD and possible dilation of any narrowing if needed.  I advised the patient to avoid eating any solid food for 2 days prior to his procedure.  He will also be started on omeprazole 40 mg every day to avoid any ongoing inflammation at the time of evaluation.  Patient understood and agreed.  On the other hand, he is due for colorectal cancer screening, I will schedule him for a colonoscopy.  - Schedule EGD with possible ED and colonoscopy - Start omeprazole 40 mg qday - Explained presumed etiology of reflux symptoms. Instruction  provided in the use of antireflux medication - patient should take medication in the morning 30-45 minutes before eating breakfast. Discussed avoidance of eating within 2 hours of lying down to sleep and benefit of blocks to elevate head of bed.   All questions were answered.      [name] Peppers, MD Gastroenterology and Hepatology [facility]

## 2020-03-10 NOTE — Telephone Encounter (Signed)
[name], CMA  

## 2020-03-11 ENCOUNTER — Other Ambulatory Visit (INDEPENDENT_AMBULATORY_CARE_PROVIDER_SITE_OTHER): Payer: Self-pay

## 2020-04-15 ENCOUNTER — Encounter (HOSPITAL_COMMUNITY): Payer: Self-pay | Admitting: Gastroenterology

## 2020-04-15 ENCOUNTER — Other Ambulatory Visit: Payer: Self-pay

## 2020-04-15 ENCOUNTER — Other Ambulatory Visit (HOSPITAL_COMMUNITY)
Admission: RE | Admit: 2020-04-15 | Discharge: 2020-04-15 | Disposition: A | Discharge status: Home / Self Care | Payer: PRIVATE HEALTH INSURANCE | Source: Ambulatory Visit | Attending: Gastroenterology | Admitting: Gastroenterology

## 2020-04-15 DIAGNOSIS — Z01812 Encounter for preprocedural laboratory examination: Secondary | ICD-10-CM | POA: Diagnosis present

## 2020-04-15 DIAGNOSIS — Z20822 Contact with and (suspected) exposure to covid-19: Secondary | ICD-10-CM | POA: Insufficient documentation

## 2020-04-15 LAB — SARS CORONAVIRUS 2 (TAT 6-24 HRS): SARS Coronavirus 2: NEGATIVE

## 2020-04-16 ENCOUNTER — Encounter (HOSPITAL_COMMUNITY): Payer: Self-pay | Admitting: Gastroenterology

## 2020-04-16 ENCOUNTER — Encounter (HOSPITAL_COMMUNITY)
Admission: RE | Disposition: A | Discharge status: Home / Self Care | Payer: Self-pay | Source: Home / Self Care | Attending: Gastroenterology

## 2020-04-16 ENCOUNTER — Ambulatory Visit (HOSPITAL_COMMUNITY)
Admission: RE | Admit: 2020-04-16 | Discharge: 2020-04-16 | Disposition: A | Discharge status: Home / Self Care | Payer: PRIVATE HEALTH INSURANCE | Attending: Gastroenterology | Admitting: Gastroenterology

## 2020-04-16 ENCOUNTER — Other Ambulatory Visit: Payer: Self-pay

## 2020-04-16 ENCOUNTER — Ambulatory Visit (HOSPITAL_COMMUNITY): Payer: PRIVATE HEALTH INSURANCE

## 2020-04-16 DIAGNOSIS — K2289 Other specified disease of esophagus: Secondary | ICD-10-CM | POA: Insufficient documentation

## 2020-04-16 DIAGNOSIS — D122 Benign neoplasm of ascending colon: Secondary | ICD-10-CM | POA: Insufficient documentation

## 2020-04-16 DIAGNOSIS — Z09 Encounter for follow-up examination after completed treatment for conditions other than malignant neoplasm: Secondary | ICD-10-CM | POA: Insufficient documentation

## 2020-04-16 DIAGNOSIS — F17210 Nicotine dependence, cigarettes, uncomplicated: Secondary | ICD-10-CM | POA: Insufficient documentation

## 2020-04-16 DIAGNOSIS — K269 Duodenal ulcer, unspecified as acute or chronic, without hemorrhage or perforation: Secondary | ICD-10-CM | POA: Insufficient documentation

## 2020-04-16 DIAGNOSIS — K3189 Other diseases of stomach and duodenum: Secondary | ICD-10-CM

## 2020-04-16 DIAGNOSIS — D123 Benign neoplasm of transverse colon: Secondary | ICD-10-CM | POA: Diagnosis not present

## 2020-04-16 DIAGNOSIS — K260 Acute duodenal ulcer with hemorrhage: Secondary | ICD-10-CM | POA: Diagnosis not present

## 2020-04-16 DIAGNOSIS — R1310 Dysphagia, unspecified: Secondary | ICD-10-CM | POA: Insufficient documentation

## 2020-04-16 DIAGNOSIS — D490 Neoplasm of unspecified behavior of digestive system: Secondary | ICD-10-CM

## 2020-04-16 DIAGNOSIS — K2270 Barrett's esophagus without dysplasia: Secondary | ICD-10-CM | POA: Insufficient documentation

## 2020-04-16 DIAGNOSIS — Z882 Allergy status to sulfonamides status: Secondary | ICD-10-CM | POA: Diagnosis not present

## 2020-04-16 DIAGNOSIS — Z1211 Encounter for screening for malignant neoplasm of colon: Secondary | ICD-10-CM | POA: Diagnosis not present

## 2020-04-16 HISTORY — PX: POLYPECTOMY: SHX5525

## 2020-04-16 HISTORY — PX: BIOPSY: SHX5522

## 2020-04-16 HISTORY — PX: COLONOSCOPY WITH PROPOFOL: SHX5780

## 2020-04-16 HISTORY — PX: ESOPHAGOGASTRODUODENOSCOPY (EGD) WITH PROPOFOL: SHX5813

## 2020-04-16 HISTORY — PX: ESOPHAGEAL DILATION: SHX303

## 2020-04-16 SURGERY — COLONOSCOPY WITH PROPOFOL
Anesthesia: General

## 2020-04-16 MED ORDER — PROPOFOL 10 MG/ML IV BOLUS
INTRAVENOUS | Status: AC
  Filled 2020-04-16: qty 120

## 2020-04-16 MED ORDER — GLYCOPYRROLATE 0.2 MG/ML IJ SOLN
INTRAMUSCULAR | Status: AC
  Administered 2020-04-16: 0.2 mg via INTRAVENOUS
  Filled 2020-04-16: qty 1

## 2020-04-16 MED ORDER — PROPOFOL 10 MG/ML IV BOLUS
INTRAVENOUS | Status: DC | PRN
Start: 2020-04-16 — End: 2020-04-16
  Administered 2020-04-16 (×5): 50 mg via INTRAVENOUS

## 2020-04-16 MED ORDER — LIDOCAINE VISCOUS HCL 2 % MT SOLN: 15.0000 mL | Freq: Once | OROMUCOSAL | Status: AC

## 2020-04-16 MED ORDER — LACTATED RINGERS IV SOLN: INTRAVENOUS | Status: DC | PRN

## 2020-04-16 MED ORDER — GLYCOPYRROLATE 0.2 MG/ML IJ SOLN: 0.2000 mg | Freq: Once | INTRAMUSCULAR | Status: AC

## 2020-04-16 MED ORDER — PROPOFOL 500 MG/50ML IV EMUL
INTRAVENOUS | Status: DC | PRN
  Administered 2020-04-16: 150 ug/kg/min via INTRAVENOUS

## 2020-04-16 MED ORDER — LIDOCAINE VISCOUS HCL 2 % MT SOLN
OROMUCOSAL | Status: AC
  Administered 2020-04-16: 15 mL via OROMUCOSAL
  Filled 2020-04-16: qty 15

## 2020-04-16 MED ORDER — LIDOCAINE HCL (CARDIAC) PF 100 MG/5ML IV SOSY
PREFILLED_SYRINGE | INTRAVENOUS | Status: DC | PRN
  Administered 2020-04-16: 60 mg via INTRAVENOUS

## 2020-04-16 MED ORDER — LACTATED RINGERS IV SOLN
INTRAVENOUS | Status: DC
  Administered 2020-04-16: 1000 mL via INTRAVENOUS

## 2020-04-16 NOTE — Anesthesia Preprocedure Evaluation (Signed)
Anesthesia Evaluation  Patient identified by MRN, date of birth, ID band Patient awake    Reviewed: Allergy & Precautions, H&P , NPO status , Patient's Chart, lab work & pertinent test results, reviewed documented beta blocker date and time   Airway Mallampati: II  TM Distance: >3 FB Neck ROM: full    Dental no notable dental hx.    Pulmonary neg pulmonary ROS, Current Smoker,    Pulmonary exam normal breath sounds clear to auscultation       Cardiovascular Exercise Tolerance: Good negative cardio ROS   Rhythm:regular Rate:Normal     Neuro/Psych negative neurological ROS  negative psych ROS   GI/Hepatic Neg liver ROS, GERD  Medicated,  Endo/Other  negative endocrine ROS  Renal/GU negative Renal ROS  negative genitourinary   Musculoskeletal   Abdominal   Peds  Hematology negative hematology ROS (+)   Anesthesia Other Findings   Reproductive/Obstetrics negative OB ROS                             Anesthesia Physical Anesthesia Plan  ASA: II  Anesthesia Plan: General   Post-op Pain Management:    Induction:   PONV Risk Score and Plan: Propofol infusion  Airway Management Planned:   Additional Equipment:   Intra-op Plan:   Post-operative Plan:   Informed Consent: I have reviewed the patients History and Physical, chart, labs and discussed the procedure including the risks, benefits and alternatives for the proposed anesthesia with the patient or authorized representative who has indicated his/her understanding and acceptance.     Dental Advisory Given  Plan Discussed with: CRNA  Anesthesia Plan Comments:         Anesthesia Quick Evaluation

## 2020-04-16 NOTE — Anesthesia Postprocedure Evaluation (Signed)
Anesthesia Post Note  Patient: [name]  Procedure(s) Performed: COLONOSCOPY WITH PROPOFOL (N/A ) ESOPHAGOGASTRODUODENOSCOPY (EGD) WITH PROPOFOL (N/A ) ESOPHAGEAL DILATION (N/A ) BIOPSY POLYPECTOMY  Patient location during evaluation: Phase II Anesthesia Type: General Level of consciousness: awake, oriented, awake and alert and patient cooperative Pain management: satisfactory to patient Vital Signs Assessment: post-procedure vital signs reviewed and stable Respiratory status: spontaneous breathing, respiratory function stable and nonlabored ventilation Postop Assessment: no apparent nausea or vomiting Anesthetic complications: no   No complications documented.   Last Vitals:  Vitals:   04/16/20 0811  BP: (!) 158/102  Pulse: 97  Resp: 15  Temp: 36.8 C  SpO2: 96%    Last Pain:  Vitals:   04/16/20 0940  TempSrc:   PainSc: 0-No pain                 Matan Steen

## 2020-04-16 NOTE — Op Note (Signed)
[facility] Patient Name: [name] Procedure Date: 04/16/2020 10:12 AM MRN: [id number] Date of Birth: [birth year] Attending MD: [name] ,  CSN: [id number] Age: 55 Admit Type: Outpatient Procedure:                Colonoscopy Indications:              Screening for colorectal malignant neoplasm Providers:                [name], [name] Sharon Seller, RN, [name], Technician Referring MD:              Medicines:                Monitored Anesthesia Care Complications:            No immediate complications. Estimated Blood Loss:     Estimated blood loss: none. Procedure:                Pre-Anesthesia Assessment:                           - Prior to the procedure, a History and Physical                            was performed, and patient medications, allergies                            and sensitivities were reviewed. The patient's                            tolerance of previous anesthesia was reviewed.                           - The risks and benefits of the procedure and the                            sedation options and risks were discussed with the                            patient. All questions were answered and informed                            consent was obtained.                           - ASA Grade Assessment: II - A patient with mild                            systemic disease.                           After obtaining informed consent, the colonoscope                            was passed under direct vision. Throughout the  procedure, the patient's blood pressure, pulse, and                            oxygen saturations were monitored continuously. The                            [id number]-[id number] scope was introduced through                            the anus and advanced to the the cecum, identified                            by appendiceal orifice and ileocecal valve. The                             colonoscopy was performed without difficulty. The                            patient tolerated the procedure well. The quality                            of the bowel preparation was adequate to identify                            polyps 6 mm and larger in size. Scope withdrawal                            time was 15 minutes. Scope In: 10:15:33 AM Scope Out: 10:39:22 AM Scope Withdrawal Time: 0 hours 19 minutes 15 seconds  Total Procedure Duration: 0 hours 23 minutes 49 seconds  Findings:      The perianal and digital rectal examinations were normal.      A 12 mm polypoid lesion was found in the proximal ascending colon, just       proximal to the IC valve. There was presence of an indentation in the       center of the lesion (?diverticula). The lesion was sessile. No bleeding       was present. Imaging was performed using white light and narrow band       imaging to visualize the mucosa. The lesion did not have any adenomatous       features. Biopsies were taken with a cold forceps for histology. The       lesions did not feel hard upon palpation with the forceps.      A 2 mm polyp was found in the transverse colon. The polyp was sessile.       The polyp was removed with a cold biopsy forceps. Resection and       retrieval were complete.      The retroflexed view of the distal rectum and anal verge was normal and       showed no anal or rectal abnormalities. Impression:               - Likely benign polypoid lesion in the proximal  ascending colon. Biopsied.                           - One 2 mm polyp in the transverse colon, removed                            with a cold biopsy forceps. Resected and retrieved.                           - The distal rectum and anal verge are normal on                            retroflexion view. Moderate Sedation:      Per Anesthesia Care Recommendation:           - Discharge patient to home  (ambulatory).                           - Resume previous diet.                           - Await pathology results.                           - Repeat colonoscopy in 5 years for screening                            purposes due to prep quality, or earlier if                            pathology findings are concerning. Procedure Code(s):        --- Professional ---                           [phone], GC, Colonoscopy, flexible; with biopsy,                            single or multiple Diagnosis Code(s):        --- Professional ---                           Z12.11, Encounter for screening for malignant                            neoplasm of colon                           D49.0, Neoplasm of unspecified behavior of                            digestive system                           K63.5, Polyp of colon CPT copyright 2019 American Medical Association. All rights reserved. The codes documented in this report are preliminary and upon coder review may  be revised to meet current compliance requirements. [name] Blazing, MD [name]  [name],  04/16/2020 10:51:34 AM This report has been signed electronically. Number of Addenda: 0

## 2020-04-16 NOTE — H&P (Signed)
[name] is an 55 y.o. male.   Chief Complaint: dysphagia and screening for colorectal cancer. HPI: 55 y.o. male with PMH GERD and emphysema, who presents to the hospital for evaluation of dysphagia and CRC screening.   Patient has had dysphagia to solids for at least 12 years, which has worsened recently now having dysphagia to even liquids occasionally.  He has had to vomit intermittently due to the dysphagia.  Had partial food impaction episode in 2009 that resolved spontaneously.  Has never had an EGD in the past.  Had heartburn previously without odynophagia.  Was started on omeprazole recently with adequate response in terms of his heartburn but still present dysphagia.  The patient has never had a colonoscopy in the past.  The patient denies having any complaints such as melena, hematochezia, abdominal pain or distention, change in her bowel movement consistency or frequency, no changes in her weight recently.  No family history of colorectal cancer.   Past Medical History:  Diagnosis Date  . Acid reflux     Past Surgical History:  Procedure Laterality Date  . LUNG SURGERY      Family History  Adopted: Yes  Problem Relation Age of Onset  . Heart disease Mother    Social History:  reports that he has been smoking cigarettes. He has been smoking about 1.00 pack per day. He has never used smokeless tobacco. He reports current alcohol use. He reports that he does not use drugs.  Allergies:  Allergies  Allergen Reactions  . Sulfonamide Derivatives     Unknown    Medications Prior to Admission  Medication Sig Dispense Refill  . omeprazole (PRILOSEC) 40 MG capsule Take 1 capsule (40 mg total) by mouth daily. (Patient not taking: Reported on 04/08/2020) 90 capsule 1    Results for orders placed or performed during the hospital encounter of 04/15/20 (from the past 48 hour(s))  SARS CORONAVIRUS 2 (TAT 6-24 HRS) Nasopharyngeal Nasopharyngeal Swab     Status: None    Collection Time: 04/15/20  8:15 AM   Specimen: Nasopharyngeal Swab  Result Value Ref Range   SARS Coronavirus 2 NEGATIVE NEGATIVE    Comment: (NOTE) SARS-CoV-2 target nucleic acids are NOT DETECTED.  The SARS-CoV-2 RNA is generally detectable in upper and lower respiratory specimens during the acute phase of infection. Negative results do not preclude SARS-CoV-2 infection, do not rule out co-infections with other pathogens, and should not be used as the sole basis for treatment or other patient management decisions. Negative results must be combined with clinical observations, patient history, and epidemiological information. The expected result is Negative.  Fact Sheet for Patients: HairSlick.no  Fact Sheet for Healthcare Providers: quierodirigir.com  This test is not yet approved or cleared by the [place] FDA and  has been authorized for detection and/or diagnosis of SARS-CoV-2 by FDA under an Emergency Use Authorization (EUA). This EUA will remain  in effect (meaning this test can be used) for the duration of the COVID-19 declaration under Se ction 564(b)(1) of the Act, 21 U.S.C. section 360bbb-3(b)(1), unless the authorization is terminated or revoked sooner.  Performed at [facility], [address] [place], [place] [address]    No results found.  Review of Systems  Constitutional: Negative.   HENT: Positive for trouble swallowing.   Eyes: Negative.   Respiratory: Negative.   Cardiovascular: Negative.   Gastrointestinal: Negative.   Endocrine: Negative.   Genitourinary: Negative.   Musculoskeletal: Negative.   Skin: Negative.  Allergic/Immunologic: Negative.   Neurological: Negative.   Hematological: Negative.   Psychiatric/Behavioral: Negative.     Blood pressure (!) 158/102, pulse 97, temperature 98.3 F (36.8 C), temperature source Oral, resp. rate 15, height 5\' 9"  (1.753 m), weight 74.8  kg, SpO2 96 %. Physical Exam  GENERAL: The patient is AO x3, in no acute distress. HEENT: Head is normocephalic and atraumatic. EOMI are intact. Mouth is well hydrated and without lesions. NECK: Supple. No masses LUNGS: Clear to auscultation. No presence of rhonchi/wheezing/rales. Adequate chest expansion HEART: RRR, normal s1 and s2. ABDOMEN: Soft, nontender, no guarding, no peritoneal signs, and nondistended. BS +. No masses. EXTREMITIES: Without any cyanosis, clubbing, rash, lesions or edema. NEUROLOGIC: AOx3, no focal motor deficit. SKIN: no jaundice, no rashes  Assessment/Plan  55 y.o. male with PMH GERD and emphysema, who presents to the hospital for evaluation of dysphagia and CRC screening.  We will proceed with EGD with possible dilation and colonoscopy.  Harvel Quale, MD 04/16/2020, 9:01 AM

## 2020-04-16 NOTE — Transfer of Care (Signed)
Immediate Anesthesia Transfer of Care Note  Patient: [name]  Procedure(s) Performed: COLONOSCOPY WITH PROPOFOL (N/A ) ESOPHAGOGASTRODUODENOSCOPY (EGD) WITH PROPOFOL (N/A ) ESOPHAGEAL DILATION (N/A ) BIOPSY POLYPECTOMY  Patient Location: PACU  Anesthesia Type:General  Level of Consciousness: awake, alert , oriented and patient cooperative  Airway & Oxygen Therapy: Patient Spontanous Breathing  Post-op Assessment: Report given to RN and Post -op Vital signs reviewed and stable  Post vital signs: Reviewed and stable  Last Vitals:  Vitals Value Taken Time  BP    Temp    Pulse    Resp    SpO2      Last Pain:  Vitals:   04/16/20 0940  TempSrc:   PainSc: 0-No pain      Patients Stated Pain Goal: 8 (04/16/20 0811)  Complications: No complications documented.

## 2020-04-16 NOTE — Op Note (Addendum)
[facility] Patient Name: [name] Procedure Date: 04/16/2020 9:32 AM MRN: [id number] Date of Birth: 01-12-1966 Attending MD: Katrinka Blazing ,  CSN: [id number] Age: 55 Admit Type: Outpatient Procedure:                Upper GI endoscopy Indications:              Dysphagia Providers:                Katrinka Blazing, Criselda Peaches. Patsy Lager, RN, Cyril Mourning, Technician Referring MD:              Medicines:                Monitored Anesthesia Care Complications:            No immediate complications. Estimated Blood Loss:     Estimated blood loss: none. Procedure:                Pre-Anesthesia Assessment:                           - Prior to the procedure, a History and Physical                            was performed, and patient medications, allergies                            and sensitivities were reviewed. The patient's                            tolerance of previous anesthesia was reviewed.                           - The risks and benefits of the procedure and the                            sedation options and risks were discussed with the                            patient. All questions were answered and informed                            consent was obtained.                           - ASA Grade Assessment: II - A patient with mild                            systemic disease.                           After obtaining informed consent, the endoscope was                            passed under direct vision. Throughout the  procedure, the patient's blood pressure, pulse, and                            oxygen saturations were monitored continuously. The                            [id number]) scope was introduced through the                            mouth, and advanced to the second part of duodenum.                            The upper GI endoscopy was accomplished without                             difficulty. The patient tolerated the procedure                            well. Scope In: 9:49:48 AM Scope Out: 10:10:19 AM Total Procedure Duration: 0 hours 20 minutes 31 seconds  Findings:      Scattered small white exudates were noted in the entire esophagus. A       guidewire was placed and the scope was withdrawn. Dilation was performed       with a Savary dilator with no resistance at 18 mm. Hematin was observed       upon reinspection of the GE juction. Biopsies were obtained from the       proximal and distal esophagus with cold forceps for histology of       suspected eosinophilic esophagitis.      There were esophageal mucosal changes suspicious for short-segment       Barrett's esophagus, classified as Barrett's stage C0-M0 per [place]       criteria present at the gastroesophageal junction. The maximum       longitudinal extent of these mucosal changes was 1 cm in length. This       was biopsied with a cold forceps for histology.      A few localized 1 to 2 mm erosions with no stigmata of recent bleeding       were found in the gastric fundus. Biopsies were taken from the body and       antrum with a cold forceps for Helicobacter pylori testing.      Multiple localized erosions without bleeding were found in the duodenal       bulb. Impression:               - White nummular lesions in esophageal mucosa.                            Dilated. Biopsied.                           - Esophageal mucosal changes suspicious for                            short-segment Barrett's esophagus, classified as  Barrett's stage C0-M0 per [place] criteria. Biopsied.                           - Erosive gastropathy with no stigmata of recent                            bleeding. Biopsied.                           - Duodenal erosions without bleeding. Moderate Sedation:      Per Anesthesia Care Recommendation:           - Discharge patient to home (ambulatory).                            - Resume previous diet.                           - Await pathology results.                           - Continue present medications, including                            omeprazole 40 mg qday.                           - H. pylori serology. Procedure Code(s):        --- Professional ---                           [phone], GC, Esophagogastroduodenoscopy, flexible,                            transoral; with insertion of guide wire followed by                            passage of dilator(s) through esophagus over guide                            wire                           43239, 107, Esophagogastroduodenoscopy, flexible,                            transoral; with biopsy, single or multiple Diagnosis Code(s):        --- Professional ---                           K22.8, Other specified diseases of esophagus                           K22.70, Barrett's esophagus without dysplasia                           K31.89, Other diseases of stomach and duodenum  K26.9, Duodenal ulcer, unspecified as acute or                            chronic, without hemorrhage or perforation                           R13.10, Dysphagia, unspecified CPT copyright 2019 American Medical Association. All rights reserved. The codes documented in this report are preliminary and upon coder review may  be revised to meet current compliance requirements. [name], MD [name],  04/16/2020 10:43:51 AM This report has been signed electronically. Number of Addenda: 0

## 2020-04-16 NOTE — OR Nursing (Signed)
[name] had a procedure at [facility] on 04/16/20 and cannot return to work until 1:00PM on 04/17/20.

## 2020-04-16 NOTE — Discharge Instructions (Signed)
You are being discharged to home.  Resume your previous diet.  We are waiting for your pathology results.  Continue your present medications, including omeprazole 40 mg qday.  Your physician has recommended a repeat colonoscopy in five years for screening purposes due to prep quality, or earlier if pathology findings are concerning.   Colonoscopy, Adult, Care After This sheet gives you information about how to care for yourself after your procedure. Your doctor may also give you more specific instructions. If you have problems or questions, call your doctor. What can I expect after the procedure? After the procedure, it is common to have:  A small amount of blood in your poop (stool) for 24 hours.  Some gas.  Mild cramping or bloating in your belly (abdomen). Follow these instructions at home: Eating and drinking   Drink enough fluid to keep your pee (urine) pale yellow.  Follow instructions from your doctor about what you cannot eat or drink.  Return to your normal diet as told by your doctor. Avoid heavy or fried foods that are hard to digest. Activity  Rest as told by your doctor.  Do not sit for a long time without moving. Get up to take short walks every 1-2 hours. This is important. Ask for help if you feel weak or unsteady.  Return to your normal activities as told by your doctor. Ask your doctor what activities are safe for you. To help cramping and bloating:   Try walking around.  Put heat on your belly as told by your doctor. Use the heat source that your doctor recommends, such as a moist heat pack or a heating pad. ? Put a towel between your skin and the heat source. ? Leave the heat on for 20-30 minutes. ? Remove the heat if your skin turns bright red. This is very important if you are unable to feel pain, heat, or cold. You may have a greater risk of getting burned. General instructions  For the first 24 hours after the procedure: ? Do not drive or use  machinery. ? Do not sign important documents. ? Do not drink alcohol. ? Do your daily activities more slowly than normal. ? Eat foods that are soft and easy to digest.  Take over-the-counter or prescription medicines only as told by your doctor.  Keep all follow-up visits as told by your doctor. This is important. Contact a doctor if:  You have blood in your poop 2-3 days after the procedure. Get help right away if:  You have more than a small amount of blood in your poop.  You see large clumps of tissue (blood clots) in your poop.  Your belly is swollen.  You feel like you may vomit (nauseous).  You vomit.  You have a fever.  You have belly pain that gets worse, and medicine does not help your pain. Summary  After the procedure, it is common to have a small amount of blood in your poop. You may also have mild cramping and bloating in your belly.  For the first 24 hours after the procedure, do not drive or use machinery, do not sign important documents, and do not drink alcohol.  Get help right away if you have a lot of blood in your poop, feel like you may vomit, have a fever, or have more belly pain. This information is not intended to replace advice given to you by your health care provider. Make sure you discuss any questions you have with your  health care provider. Document Revised: 10/23/2018 Document Reviewed: 10/23/2018 Elsevier Patient Education  [employer] [place].  Upper Endoscopy, Adult, Care After This sheet gives you information about how to care for yourself after your procedure. Your health care provider may also give you more specific instructions. If you have problems or questions, contact your health care provider. What can I expect after the procedure? After the procedure, it is common to have:  A sore throat.  Mild stomach pain or discomfort.  Bloating.  Nausea. Follow these instructions at home:   Follow instructions from your health care  provider about what to eat or drink after your procedure.  Return to your normal activities as told by your health care provider. Ask your health care provider what activities are safe for you.  Take over-the-counter and prescription medicines only as told by your health care provider.  Do not drive for 24 hours if you were given a sedative during your procedure.  Keep all follow-up visits as told by your health care provider. This is important. Contact a health care provider if you have:  A sore throat that lasts longer than one day.  Trouble swallowing. Get help right away if:  You vomit blood or your vomit looks like coffee grounds.  You have: ? A fever. ? Bloody, black, or tarry stools. ? A severe sore throat or you cannot swallow. ? Difficulty breathing. ? Severe pain in your chest or abdomen. Summary  After the procedure, it is common to have a sore throat, mild stomach discomfort, bloating, and nausea.  Do not drive for 24 hours if you were given a sedative during the procedure.  Follow instructions from your health care provider about what to eat or drink after your procedure.  Return to your normal activities as told by your health care provider. This information is not intended to replace advice given to you by your health care provider. Make sure you discuss any questions you have with your health care provider. Document Revised: 09/20/2017 Document Reviewed: 08/29/2017 Elsevier Patient Education  [place].  Colon Polyps  Polyps are tissue growths inside the body. Polyps can grow in many places, including the large intestine (colon). A polyp may be a round bump or a mushroom-shaped growth. You could have one polyp or several. Most colon polyps are noncancerous (benign). However, some colon polyps can become cancerous over time. Finding and removing the polyps early can help prevent this. What are the causes? The exact cause of colon polyps is not  known. What increases the risk? You are more likely to develop this condition if you:  Have a family history of colon cancer or colon polyps.  Are older than 26 or older than 45 if you are African American.  Have inflammatory bowel disease, such as ulcerative colitis or Crohn's disease.  Have certain hereditary conditions, such as: ? Familial adenomatous polyposis. ? Lynch syndrome. ? Turcot syndrome. ? Peutz-Jeghers syndrome.  Are overweight.  Smoke cigarettes.  Do not get enough exercise.  Drink too much alcohol.  Eat a diet that is high in fat and red meat and low in fiber.  Had childhood cancer that was treated with abdominal radiation. What are the signs or symptoms? Most polyps do not cause symptoms. If you have symptoms, they may include:  Blood coming from your rectum when having a bowel movement.  Blood in your stool. The stool may look dark red or black.  Abdominal pain.  A change in bowel  habits, such as constipation or diarrhea. How is this diagnosed? This condition is diagnosed with a colonoscopy. This is a procedure in which a lighted, flexible scope is inserted into the anus and then passed into the colon to examine the area. Polyps are sometimes found when a colonoscopy is done as part of routine cancer screening tests. How is this treated? Treatment for this condition involves removing any polyps that are found. Most polyps can be removed during a colonoscopy. Those polyps will then be tested for cancer. Additional treatment may be needed depending on the results of testing. Follow these instructions at home: Lifestyle  Maintain a healthy weight, or lose weight if recommended by your health care provider.  Exercise every day or as told by your health care provider.  Do not use any products that contain nicotine or tobacco, such as cigarettes and e-cigarettes. If you need help quitting, ask your health care provider.  If you drink alcohol, limit how  much you have: ? 0-1 drink a day for women. ? 0-2 drinks a day for men.  Be aware of how much alcohol is in your drink. In the [place] one drink equals one 12 oz bottle of beer (355 mL), one 5 oz glass of wine (148 mL), or one 1 oz shot of hard liquor (44 mL). Eating and drinking   Eat foods that are high in fiber, such as fruits, vegetables, and whole grains.  Eat foods that are high in calcium and vitamin D, such as milk, cheese, yogurt, eggs, liver, fish, and broccoli.  Limit foods that are high in fat, such as fried foods and desserts.  Limit the amount of red meat and processed meat you eat, such as hot dogs, sausage, bacon, and lunch meats. General instructions  Keep all follow-up visits as told by your health care provider. This is important. ? This includes having regularly scheduled colonoscopies. ? Talk to your health care provider about when you need a colonoscopy. Contact a health care provider if:  You have new or worsening bleeding during a bowel movement.  You have new or increased blood in your stool.  You have a change in bowel habits.  You lose weight for no known reason. Summary  Polyps are tissue growths inside the body. Polyps can grow in many places, including the colon.  Most colon polyps are noncancerous (benign), but some can become cancerous over time.  This condition is diagnosed with a colonoscopy.  Treatment for this condition involves removing any polyps that are found. Most polyps can be removed during a colonoscopy. This information is not intended to replace advice given to you by your health care provider. Make sure you discuss any questions you have with your health care provider. Document Revised: 07/14/2017 Document Reviewed: 07/14/2017 Elsevier Patient Education  2020 [employer].

## 2020-04-17 ENCOUNTER — Other Ambulatory Visit: Payer: Self-pay

## 2020-04-17 ENCOUNTER — Other Ambulatory Visit (INDEPENDENT_AMBULATORY_CARE_PROVIDER_SITE_OTHER): Payer: Self-pay | Admitting: Gastroenterology

## 2020-04-17 LAB — H. PYLORI ANTIBODY, IGG: H Pylori IgG: 0.21 Index Value (ref 0.00–0.79)

## 2020-04-17 LAB — SURGICAL PATHOLOGY

## 2020-04-18 ENCOUNTER — Encounter (HOSPITAL_COMMUNITY): Payer: Self-pay | Admitting: Gastroenterology

## 2020-07-02 ENCOUNTER — Other Ambulatory Visit (INDEPENDENT_AMBULATORY_CARE_PROVIDER_SITE_OTHER): Payer: Self-pay

## 2020-07-02 ENCOUNTER — Telehealth (INDEPENDENT_AMBULATORY_CARE_PROVIDER_SITE_OTHER): Payer: Self-pay

## 2020-07-02 ENCOUNTER — Encounter (INDEPENDENT_AMBULATORY_CARE_PROVIDER_SITE_OTHER): Payer: Self-pay

## 2020-07-02 DIAGNOSIS — Z1211 Encounter for screening for malignant neoplasm of colon: Secondary | ICD-10-CM

## 2020-07-02 MED ORDER — PEG 3350-KCL-NA BICARB-NACL 420 G PO SOLR: 4000.0000 mL | ORAL | 0 refills | Status: AC

## 2020-07-02 NOTE — Telephone Encounter (Signed)
[name], CMA  

## 2020-07-11 ENCOUNTER — Other Ambulatory Visit (INDEPENDENT_AMBULATORY_CARE_PROVIDER_SITE_OTHER): Payer: Self-pay

## 2020-07-15 ENCOUNTER — Encounter (HOSPITAL_COMMUNITY): Payer: Self-pay | Admitting: Emergency Medicine

## 2020-07-15 ENCOUNTER — Emergency Department (HOSPITAL_COMMUNITY)
Admission: EM | Admit: 2020-07-15 | Discharge: 2020-07-15 | Disposition: A | Discharge status: Home / Self Care | Payer: No Typology Code available for payment source | Attending: Emergency Medicine | Admitting: Emergency Medicine

## 2020-07-15 ENCOUNTER — Other Ambulatory Visit: Payer: Self-pay

## 2020-07-15 DIAGNOSIS — X58XXXA Exposure to other specified factors, initial encounter: Secondary | ICD-10-CM | POA: Insufficient documentation

## 2020-07-15 DIAGNOSIS — F17210 Nicotine dependence, cigarettes, uncomplicated: Secondary | ICD-10-CM | POA: Insufficient documentation

## 2020-07-15 DIAGNOSIS — S61209A Unspecified open wound of unspecified finger without damage to nail, initial encounter: Secondary | ICD-10-CM

## 2020-07-15 DIAGNOSIS — S61202A Unspecified open wound of right middle finger without damage to nail, initial encounter: Secondary | ICD-10-CM | POA: Insufficient documentation

## 2020-07-15 DIAGNOSIS — S6991XA Unspecified injury of right wrist, hand and finger(s), initial encounter: Secondary | ICD-10-CM | POA: Diagnosis present

## 2020-07-15 MED ORDER — DOXYCYCLINE HYCLATE 100 MG PO TABS
100.0000 mg | ORAL_TABLET | Freq: Once | ORAL | Status: AC
  Administered 2020-07-15: 100 mg via ORAL
  Filled 2020-07-15: qty 1

## 2020-07-15 MED ORDER — DOXYCYCLINE HYCLATE 100 MG PO CAPS: 100.0000 mg | ORAL_CAPSULE | Freq: Two times a day (BID) | ORAL | 0 refills | Status: AC

## 2020-07-15 NOTE — ED Provider Notes (Signed)
[facility] Provider Note   CSN: [id number] Arrival date & time: 07/15/20  1358     History Chief Complaint  Patient presents with  . Hand Pain    [name] is a 55 y.o. male past medical history of MRSA, presenting for evaluation of wound to right third digit.  He states in the wintertime skin of his fingertips always crack due to being very dry and frequent use of his hands.  He states he is a [employer].  He had a usual crack to the skin on the tip of his right middle finger, however became more painful on Friday.  The pain is mostly to the tip of his finger with palpation, however he does experience some radiating pain up into his arm.  He denies fevers or chills, denies drainage.  Denies history of diabetes or immunocompromise  The history is provided by the patient.       Past Medical History:  Diagnosis Date  . Acid reflux     Patient Active Problem List   Diagnosis Date Noted  . Dysphagia 03/10/2020  . GERD (gastroesophageal reflux disease) 03/10/2020  . Screening for colorectal cancer 03/10/2020  . ARTHROSCOPY, RIGHT KNEE, HX OF 08/22/2006  . METHICILLIN RESISTANT STAPHYLOCOCCUS AUREUS INFECTION 07/25/2006  . EMPYEMA 07/25/2006    Past Surgical History:  Procedure Laterality Date  . BIOPSY  04/16/2020   Procedure: BIOPSY;  Surgeon: Harvel Quale, MD;  [facility]: [facility];  Service: Gastroenterology;;  gastric esophageal colon  . COLONOSCOPY WITH PROPOFOL N/A 04/16/2020   Procedure: COLONOSCOPY WITH PROPOFOL;  Surgeon: Harvel Quale, MD;  Location: [facility];  Service: Gastroenterology;  Laterality: N/A;  9:45  . ESOPHAGEAL DILATION N/A 04/16/2020   Procedure: ESOPHAGEAL DILATION;  Surgeon: Harvel Quale, MD;  Location: [facility];  Service: Gastroenterology;  Laterality: N/A;  . ESOPHAGOGASTRODUODENOSCOPY (EGD) WITH PROPOFOL N/A 04/16/2020   Procedure: ESOPHAGOGASTRODUODENOSCOPY (EGD) WITH PROPOFOL;   Surgeon: Harvel Quale, MD;  Location: [facility];  Service: Gastroenterology;  Laterality: N/A;  . LUNG SURGERY    . POLYPECTOMY  04/16/2020   Procedure: POLYPECTOMY;  Surgeon: Harvel Quale, MD;  Location: [facility];  Service: Gastroenterology;;  colon       Family History  Adopted: Yes  Problem Relation Age of Onset  . Heart disease Mother     Social History   Tobacco Use  . Smoking status: Current Every Day Smoker    Packs/day: 1.00    Types: Cigarettes  . Smokeless tobacco: Never Used  Substance Use Topics  . Alcohol use: Yes    Comment: social  . Drug use: Never    Home Medications Prior to Admission medications   Medication Sig Start Date End Date Taking? Authorizing Provider  doxycycline (VIBRAMYCIN) 100 MG capsule Take 1 capsule (100 mg total) by mouth 2 (two) times daily. 07/15/20  Yes Mattheo Swindle, [place] N, PA-C  omeprazole (PRILOSEC) 40 MG capsule Take 1 capsule (40 mg total) by mouth daily. Patient not taking: Reported on 04/08/2020 03/10/20   Montez Morita, Quillian Quince, MD  polyethylene glycol-electrolytes (TRILYTE) 420 g solution Take 4,000 mLs by mouth as directed. 07/02/20   Harvel Quale, MD    Allergies    Sulfonamide derivatives  Review of Systems   Review of Systems  Constitutional: Negative for fever.  Skin: Positive for wound.    Physical Exam Updated Vital Signs BP (!) 144/102   Pulse 87   Temp  98.2 F (36.8 C) (Oral)   Resp 18   Ht 5\' 9"  (1.753 m)   Wt 72.6 kg   SpO2 96%   BMI 23.63 kg/m   Physical Exam Vitals and nursing note reviewed.  Constitutional:      Appearance: He is well-developed.  HENT:     Head: Normocephalic and atraumatic.  Eyes:     Conjunctiva/sclera: Conjunctivae normal.  Cardiovascular:     Rate and Rhythm: Normal rate.  Pulmonary:     Effort: Pulmonary effort is normal.  Skin:    Comments: 1 cm linear horizontal wound to distal tip of right third digit. Wound is  parallel to the distal end of the nail, within a few mm distance, does not involve nail bed. Skin appears callused and dry.  Skin of the hand also appears dirty (patient reports this is stained from job). no drainage or erythema. No significant swelling noted to the tip of the digit. No felon or paronychia. Specifically, no redness or swelling is extending into the proximal aspect of the digit.  Full range of motion of the digit without difficulty or pain. Strong radial pulse.   Neurological:     Mental Status: He is alert.  Psychiatric:        Mood and Affect: Mood normal.        Behavior: Behavior normal.     ED Results / Procedures / Treatments   Labs (all labs ordered are listed, but only abnormal results are displayed) Labs Reviewed - No data to display  EKG None  Radiology No results found.  Procedures Procedures   Medications Ordered in ED Medications  doxycycline (VIBRA-TABS) tablet 100 mg (has no administration in time range)    ED Course  I have reviewed the triage vital signs and the nursing notes.  Pertinent labs & imaging results that were available during my care of the patient were reviewed by me and considered in my medical decision making (see chart for details).    MDM Rules/Calculators/A&P                          Patient with wound to tip of middle finger, does not show signs of significant infection or abscess though will cover with doxycycline considering hx of MRSA and worsening pain. Discussed ways to improve hydration to hands to prevent recurrent cracking of the skin, including emollients at bedtime. Discussed wound care and strict return precautions.  PCP follow up in 3 days. Patient appropriate for discharge.  Discussed results, findings, treatment and follow up. Patient advised of return precautions. Patient verbalized understanding and agreed with plan.  Final Clinical Impression(s) / ED Diagnoses Final diagnoses:  Finger wound, simple, open,  initial encounter    Rx / DC Orders ED Discharge Orders         Ordered    doxycycline (VIBRAMYCIN) 100 MG capsule  2 times daily        07/15/20 1444           Mersades Barbaro, [place] N, [place] 07/15/20 1444    Milton Ferguson, MD 07/16/20 0840

## 2020-07-15 NOTE — ED Triage Notes (Signed)
Pt c/o right middle finger pain and small laceration noted to tip of finger. Pt states he has pain shooting from finger to upper arm. Pt states pain started friday

## 2020-07-15 NOTE — Discharge Instructions (Addendum)
Please read instructions below.  Keep your wounds clean and covered. Gently clean your wounds with soap and water daily, pat dry, and reapply a clean bandage. Apply topical antibiotic ointment, such as bacitracin or neosporin daily.  Follow up with your primary care or urgent care for wound recheck in about 3 days.  Return to the ER for fever, pus draining from wound, worsening redness or swelling to your finger, or new or worsening symptoms.

## 2020-07-21 ENCOUNTER — Other Ambulatory Visit (HOSPITAL_COMMUNITY)
Admission: RE | Admit: 2020-07-21 | Discharge: 2020-07-21 | Disposition: A | Discharge status: Home / Self Care | Payer: No Typology Code available for payment source | Source: Ambulatory Visit | Attending: Gastroenterology | Admitting: Gastroenterology

## 2020-07-21 NOTE — Progress Notes (Signed)
Patient  Missed his Covid appointment this morning. Called patient and left message with voicemail. Asked pt. If he could come this afternoon before 4:00, since his procedure is tomorrow. Waiting.  I didn't hear form the patient but I noticed he did cancel his procedure, no need for Covid test.

## 2020-07-21 NOTE — OR Nursing (Signed)
Have attempted to call patient multiple times to give time for procedure. Unable to reach patient. Patient did not show up for Covid test this morning. Procedure cancelled for tomorrow.[place] notified.

## 2020-07-22 ENCOUNTER — Encounter (HOSPITAL_COMMUNITY): Admission: RE | Payer: Self-pay | Source: Ambulatory Visit

## 2020-07-22 ENCOUNTER — Ambulatory Visit (HOSPITAL_COMMUNITY)
Admission: RE | Admit: 2020-07-22 | Payer: PRIVATE HEALTH INSURANCE | Source: Ambulatory Visit | Admitting: Gastroenterology

## 2020-07-22 ENCOUNTER — Telehealth (INDEPENDENT_AMBULATORY_CARE_PROVIDER_SITE_OTHER): Payer: Self-pay | Admitting: Gastroenterology

## 2020-07-22 SURGERY — COLONOSCOPY WITH PROPOFOL
Anesthesia: Monitor Anesthesia Care

## 2020-07-22 NOTE — Telephone Encounter (Signed)
I tried to reach patient regarding the reason why he canceled his colonoscopy as he did not show up for his preop testing, however his voicemail is not set up. Darius Bump, can you please reach him and try to reschedule his colonoscopy for history of colon polyps? Thanks

## 2020-07-22 NOTE — Telephone Encounter (Signed)
Thanks so much. 

## 2020-07-22 NOTE — Telephone Encounter (Signed)
I've tried numerous times in the last couple days to reach him and he wont answer or call back , but I will keep trying

## 2020-08-13 ENCOUNTER — Emergency Department (HOSPITAL_COMMUNITY)
Admission: EM | Admit: 2020-08-13 | Discharge: 2020-08-13 | Disposition: A | Discharge status: Home / Self Care | Payer: No Typology Code available for payment source | Attending: Emergency Medicine | Admitting: Emergency Medicine

## 2020-08-13 ENCOUNTER — Encounter (HOSPITAL_COMMUNITY): Payer: Self-pay

## 2020-08-13 ENCOUNTER — Emergency Department (HOSPITAL_COMMUNITY): Payer: No Typology Code available for payment source

## 2020-08-13 ENCOUNTER — Other Ambulatory Visit: Payer: Self-pay

## 2020-08-13 DIAGNOSIS — F17210 Nicotine dependence, cigarettes, uncomplicated: Secondary | ICD-10-CM | POA: Insufficient documentation

## 2020-08-13 DIAGNOSIS — R109 Unspecified abdominal pain: Secondary | ICD-10-CM | POA: Diagnosis not present

## 2020-08-13 DIAGNOSIS — R197 Diarrhea, unspecified: Secondary | ICD-10-CM

## 2020-08-13 LAB — CBC WITH DIFFERENTIAL/PLATELET
Abs Immature Granulocytes: 0.01 10*3/uL (ref 0.00–0.07)
Basophils Absolute: 0 10*3/uL (ref 0.0–0.1)
Basophils Relative: 1 %
Eosinophils Absolute: 0.1 10*3/uL (ref 0.0–0.5)
Eosinophils Relative: 2 %
HCT: 43.3 % (ref 39.0–52.0)
Hemoglobin: 15.1 g/dL (ref 13.0–17.0)
Immature Granulocytes: 0 %
Lymphocytes Relative: 29 %
Lymphs Abs: 1.3 10*3/uL (ref 0.7–4.0)
MCH: 36 pg — ABNORMAL HIGH (ref 26.0–34.0)
MCHC: 34.9 g/dL (ref 30.0–36.0)
MCV: 103.1 fL — ABNORMAL HIGH (ref 80.0–100.0)
Monocytes Absolute: 0.6 10*3/uL (ref 0.1–1.0)
Monocytes Relative: 14 %
Neutro Abs: 2.3 10*3/uL (ref 1.7–7.7)
Neutrophils Relative %: 54 %
Platelets: 225 10*3/uL (ref 150–400)
RBC: 4.2 MIL/uL — ABNORMAL LOW (ref 4.22–5.81)
RDW: 13.2 % (ref 11.5–15.5)
WBC: 4.3 10*3/uL (ref 4.0–10.5)
nRBC: 0 % (ref 0.0–0.2)

## 2020-08-13 LAB — COMPREHENSIVE METABOLIC PANEL
ALT: 16 U/L (ref 0–44)
AST: 29 U/L (ref 15–41)
Albumin: 3.4 g/dL — ABNORMAL LOW (ref 3.5–5.0)
Alkaline Phosphatase: 57 U/L (ref 38–126)
Anion gap: 8 (ref 5–15)
BUN: 5 mg/dL — ABNORMAL LOW (ref 6–20)
CO2: 24 mmol/L (ref 22–32)
Calcium: 8.8 mg/dL — ABNORMAL LOW (ref 8.9–10.3)
Chloride: 103 mmol/L (ref 98–111)
Creatinine, Ser: 0.72 mg/dL (ref 0.61–1.24)
GFR, Estimated: >60 mL/min (ref >60)
Glucose, Bld: 98 mg/dL (ref 70–99)
Potassium: 3.9 mmol/L (ref 3.5–5.1)
Sodium: 135 mmol/L (ref 135–145)
Total Bilirubin: 0.7 mg/dL (ref 0.3–1.2)
Total Protein: 6.7 g/dL (ref 6.5–8.1)

## 2020-08-13 LAB — URINALYSIS, ROUTINE W REFLEX MICROSCOPIC
Bacteria, UA: NONE SEEN
Bilirubin Urine: NEGATIVE
Glucose, UA: NEGATIVE mg/dL
Hgb urine dipstick: NEGATIVE
Ketones, ur: NEGATIVE mg/dL
Leukocytes,Ua: NEGATIVE
Nitrite: NEGATIVE
Protein, ur: 30 mg/dL — AB
Specific Gravity, Urine: 1.005 (ref 1.005–1.030)
pH: 8 (ref 5.0–8.0)

## 2020-08-13 LAB — LACTOFERRIN, FECAL, QUALITATIVE: Lactoferrin, Fecal, Qual: NEGATIVE

## 2020-08-13 MED ORDER — IOHEXOL 300 MG/ML  SOLN
100.0000 mL | Freq: Once | INTRAMUSCULAR | Status: AC | PRN
  Administered 2020-08-13: 100 mL via INTRAVENOUS

## 2020-08-13 NOTE — ED Triage Notes (Signed)
Pt presents to ED with complaints of explosive diarrhea x couple of days and thick vomit. Denies fever.

## 2020-08-13 NOTE — Discharge Instructions (Addendum)
Follow-up with Dr. Laural Golden the stomach specialist in the next couple weeks.  Follow-up with your doctor in a week to check on your urine culture results

## 2020-08-13 NOTE — ED Notes (Signed)
Patient has removed his b/p cuff and pulse ox - standing at the door.

## 2020-08-13 NOTE — ED Provider Notes (Signed)
[facility] EMERGENCY DEPARTMENT Provider Note   CSN: [id number] Arrival date & time: 08/13/20  0728     History Chief Complaint  Patient presents with  . Diarrhea    ADDAM GOELLER is a 55 y.o. male.  Patient complains of diarrhea.  Patient states he has been having diarrhea for months no significant abdominal discomfort but some cramping no blood in his diarrhea no vomiting no fever no chills  The history is provided by the patient and medical records. No language interpreter was used.  Diarrhea Quality:  Mucous Severity:  Moderate Onset quality:  Gradual Timing:  Constant Progression:  Unchanged Relieved by:  Nothing Associated symptoms: no abdominal pain and no headaches        Past Medical History:  Diagnosis Date  . Acid reflux     Patient Active Problem List   Diagnosis Date Noted  . Dysphagia 03/10/2020  . GERD (gastroesophageal reflux disease) 03/10/2020  . Screening for colorectal cancer 03/10/2020  . ARTHROSCOPY, RIGHT KNEE, HX OF 08/22/2006  . METHICILLIN RESISTANT STAPHYLOCOCCUS AUREUS INFECTION 07/25/2006  . EMPYEMA 07/25/2006    Past Surgical History:  Procedure Laterality Date  . BIOPSY  04/16/2020   Procedure: BIOPSY;  Surgeon: Harvel Quale, MD;  [facility];  Service: Gastroenterology;;  gastric esophageal colon  . COLONOSCOPY WITH PROPOFOL N/A 04/16/2020   Procedure: COLONOSCOPY WITH PROPOFOL;  Surgeon: Harvel Quale, MD;  [facility];  Service: Gastroenterology;  Laterality: N/A;  9:45  . ESOPHAGEAL DILATION N/A 04/16/2020   Procedure: ESOPHAGEAL DILATION;  Surgeon: Harvel Quale, MD;  [facility];  Service: Gastroenterology;  Laterality: N/A;  . ESOPHAGOGASTRODUODENOSCOPY (EGD) WITH PROPOFOL N/A 04/16/2020   Procedure: ESOPHAGOGASTRODUODENOSCOPY (EGD) WITH PROPOFOL;  Surgeon: Harvel Quale, MD;  [facility];  Service: Gastroenterology;  Laterality: N/A;   . LUNG SURGERY    . POLYPECTOMY  04/16/2020   Procedure: POLYPECTOMY;  Surgeon: Harvel Quale, MD;  [facility];  Service: Gastroenterology;;  colon       Family History  Adopted: Yes  Problem Relation Age of Onset  . Heart disease Mother     Social History   Tobacco Use  . Smoking status: Current Every Day Smoker    Packs/day: 1.00    Types: Cigarettes  . Smokeless tobacco: Never Used  Substance Use Topics  . Alcohol use: Yes    Comment: social  . Drug use: Never    Home Medications Prior to Admission medications   Medication Sig Start Date End Date Taking? Authorizing Provider  doxycycline (VIBRAMYCIN) 100 MG capsule Take 1 capsule (100 mg total) by mouth 2 (two) times daily. Patient not taking: No sig reported 07/15/20   Robinson, [place] N, PA-C  omeprazole (PRILOSEC) 40 MG capsule Take 1 capsule (40 mg total) by mouth daily. Patient not taking: No sig reported 03/10/20   Montez Morita, Quillian Quince, MD  polyethylene glycol-electrolytes (TRILYTE) 420 g solution Take 4,000 mLs by mouth as directed. Patient not taking: No sig reported 07/02/20   Harvel Quale, MD    Allergies    Sulfonamide derivatives  Review of Systems   Review of Systems  Constitutional: Negative for appetite change and fatigue.  HENT: Negative for congestion, ear discharge and sinus pressure.   Eyes: Negative for discharge.  Respiratory: Negative for cough.   Cardiovascular: Negative for chest pain.  Gastrointestinal: Positive for diarrhea. Negative for abdominal pain.  Genitourinary: Negative for frequency and  hematuria.  Musculoskeletal: Negative for back pain.  Skin: Negative for rash.  Neurological: Negative for seizures and headaches.  Psychiatric/Behavioral: Negative for hallucinations.    Physical Exam Updated Vital Signs BP (!) 128/97 (BP Location: Left Arm)   Pulse (!) 104   Temp 97.7 F (36.5 C) (Oral)   Resp 18   Ht 5\' 9"  (1.753 m)   Wt 72.6  kg   SpO2 96%   BMI 23.63 kg/m   Physical Exam Vitals and nursing note reviewed.  Constitutional:      Appearance: He is well-developed.  HENT:     Head: Normocephalic.     Nose: Nose normal.  Eyes:     General: No scleral icterus.    Conjunctiva/sclera: Conjunctivae normal.  Neck:     Thyroid: No thyromegaly.  Cardiovascular:     Rate and Rhythm: Normal rate and regular rhythm.     Heart sounds: No murmur heard. No friction rub. No gallop.   Pulmonary:     Breath sounds: No stridor. No wheezing or rales.  Chest:     Chest wall: No tenderness.  Abdominal:     General: There is no distension.     Tenderness: There is no abdominal tenderness. There is no rebound.  Musculoskeletal:        General: Normal range of motion.     Cervical back: Neck supple.  Lymphadenopathy:     Cervical: No cervical adenopathy.  Skin:    Findings: No erythema or rash.  Neurological:     Mental Status: He is alert and oriented to person, place, and time.     Motor: No abnormal muscle tone.     Coordination: Coordination normal.  Psychiatric:        Behavior: Behavior normal.     ED Results / Procedures / Treatments   Labs (all labs ordered are listed, but only abnormal results are displayed) Labs Reviewed  CBC WITH DIFFERENTIAL/PLATELET - Abnormal; Notable for the following components:      Result Value   RBC 4.20 (*)    MCV 103.1 (*)    MCH 36.0 (*)    All other components within normal limits  COMPREHENSIVE METABOLIC PANEL - Abnormal; Notable for the following components:   BUN 5 (*)    Calcium 8.8 (*)    Albumin 3.4 (*)    All other components within normal limits  GASTROINTESTINAL PANEL BY PCR, STOOL (REPLACES STOOL CULTURE)  URINE CULTURE  LACTOFERRIN, FECAL,QUALITATIVE  URINALYSIS, ROUTINE W REFLEX MICROSCOPIC    EKG None  Radiology CT ABDOMEN PELVIS W CONTRAST  Result Date: 08/13/2020 CLINICAL DATA:  Abdominal pain with intermittent diarrhea EXAM: CT ABDOMEN AND  PELVIS WITH CONTRAST TECHNIQUE: Multidetector CT imaging of the abdomen and pelvis was performed using the standard protocol following bolus administration of intravenous contrast. CONTRAST:  128mL OMNIPAQUE IOHEXOL 300 MG/ML  SOLN COMPARISON:  CT chest December 23, 2019 FINDINGS: Lower chest: There is mild scarring in the lateral and posterior left base regions. There is a 4 mm nodular opacity in the inferior aspect of the lateral segment of the right lobe of the liver seen on axial slice 14 series 5, unchanged from prior study. Hepatobiliary: There is hepatic steatosis. No focal liver lesions are appreciable. There are apparent small gallstones in the gallbladder. No appreciable gallbladder wall thickening by CT. No biliary duct dilatation. Pancreas: No pancreatic mass or inflammatory focus. Spleen: No splenic lesions are evident. Adrenals/Urinary Tract: Adrenals bilaterally appear unremarkable. There is  no evident renal mass or hydronephrosis on either side. There is no appreciable renal or ureteral calculus on either side. Urinary bladder is midline. Urinary bladder wall appears mildly thickened. Stomach/Bowel: There is no appreciable bowel wall thickening. There is no evident bowel obstruction. The terminal ileum appears normal. Appendix appears normal. There is no appreciable free air or portal venous air. Vascular/Lymphatic: There is no abdominal aortic aneurysm. There is aortic and iliac artery atherosclerosis. Major venous structures appear patent. There is no appreciable adenopathy in the abdomen or pelvis. Reproductive: There are occasional prostatic calculi. Prostate and seminal vesicles are normal in size in contour. Other: No evident ascites or abscess in the abdomen or pelvis. There is slight fat in each inguinal ring. Musculoskeletal: No blastic or lytic bone lesions. Cyst noted in the lateral right acetabulum measuring 6 x 6 mm, benign and likely secondary to arthropathy. No abdominal wall or  intramuscular lesions. IMPRESSION: 1. There is evidence of a degree of urinary bladder wall thickening, concerning for cystitis. Correlation with urinalysis advised. 2.  No renal or ureteral calculi.  No hydronephrosis on either side. 3. Small gallstones within gallbladder. No gallbladder wall thickening by CT. 4. No bowel wall thickening or bowel obstruction. No abscess in the abdomen or pelvis. Appendix appears normal. 5.  Aortic Atherosclerosis (ICD10-I70.0). 6. Stable 4 mm nodular opacity lateral right base. Scarring lateral left base. Electronically Signed   By: Lowella Grip III M.D.   On: 08/13/2020 10:53    Procedures Procedures   Medications Ordered in ED Medications  iohexol (OMNIPAQUE) 300 MG/ML solution 100 mL (100 mLs Intravenous Contrast Given 08/13/20 1010)    ED Course  I have reviewed the triage vital signs and the nursing notes.  Pertinent labs & imaging results that were available during my care of the patient were reviewed by me and considered in my medical decision making (see chart for details).   Labs unremarkable.  CT scan shows possible cystitis but patient is asymptomatic we will culture his urine and have his family doctor follow that up.  Patient also had stool specimen sent and he will follow-up with GI MDM Rules/Calculators/A&P                          Chronic diarrhea.  No obvious infection.  Patient will follow up with GI for further treatment Final Clinical Impression(s) / ED Diagnoses Final diagnoses:  Diarrhea, unspecified type    Rx / DC Orders ED Discharge Orders    None       Milton Ferguson, MD 08/13/20 1441

## 2020-08-14 LAB — GASTROINTESTINAL PANEL BY PCR, STOOL (REPLACES STOOL CULTURE)

## 2020-08-15 LAB — URINE CULTURE: Culture: <10000 — AB

## 2020-08-23 ENCOUNTER — Emergency Department (HOSPITAL_COMMUNITY)
Admission: EM | Admit: 2020-08-23 | Discharge: 2020-08-23 | Disposition: A | Discharge status: Home / Self Care | Payer: No Typology Code available for payment source | Attending: Emergency Medicine | Admitting: Emergency Medicine

## 2020-08-23 ENCOUNTER — Other Ambulatory Visit: Payer: Self-pay

## 2020-08-23 ENCOUNTER — Encounter (HOSPITAL_COMMUNITY): Payer: Self-pay | Admitting: Emergency Medicine

## 2020-08-23 DIAGNOSIS — R112 Nausea with vomiting, unspecified: Secondary | ICD-10-CM | POA: Diagnosis present

## 2020-08-23 DIAGNOSIS — A040 Enteropathogenic Escherichia coli infection: Secondary | ICD-10-CM | POA: Diagnosis not present

## 2020-08-23 DIAGNOSIS — F17210 Nicotine dependence, cigarettes, uncomplicated: Secondary | ICD-10-CM | POA: Diagnosis not present

## 2020-08-23 DIAGNOSIS — R000 Tachycardia, unspecified: Secondary | ICD-10-CM | POA: Insufficient documentation

## 2020-08-23 LAB — CBC WITH DIFFERENTIAL/PLATELET
Abs Immature Granulocytes: 0.02 10*3/uL (ref 0.00–0.07)
Basophils Absolute: 0 10*3/uL (ref 0.0–0.1)
Basophils Relative: 1 %
Eosinophils Absolute: 0 10*3/uL (ref 0.0–0.5)
Eosinophils Relative: 1 %
HCT: 40.4 % (ref 39.0–52.0)
Hemoglobin: 14.9 g/dL (ref 13.0–17.0)
Immature Granulocytes: 0 %
Lymphocytes Relative: 21 %
Lymphs Abs: 1.5 10*3/uL (ref 0.7–4.0)
MCH: 36.7 pg — ABNORMAL HIGH (ref 26.0–34.0)
MCHC: 36.9 g/dL — ABNORMAL HIGH (ref 30.0–36.0)
MCV: 99.5 fL (ref 80.0–100.0)
Monocytes Absolute: 0.6 10*3/uL (ref 0.1–1.0)
Monocytes Relative: 8 %
Neutro Abs: 4.9 10*3/uL (ref 1.7–7.7)
Neutrophils Relative %: 69 %
Platelets: 104 10*3/uL — ABNORMAL LOW (ref 150–400)
RBC: 4.06 MIL/uL — ABNORMAL LOW (ref 4.22–5.81)
RDW: 12.6 % (ref 11.5–15.5)
WBC: 7.1 10*3/uL (ref 4.0–10.5)
nRBC: 0 % (ref 0.0–0.2)

## 2020-08-23 LAB — COMPREHENSIVE METABOLIC PANEL
ALT: 18 U/L (ref 0–44)
AST: 38 U/L (ref 15–41)
Albumin: 3.5 g/dL (ref 3.5–5.0)
Alkaline Phosphatase: 60 U/L (ref 38–126)
Anion gap: 9 (ref 5–15)
BUN: <5 mg/dL — ABNORMAL LOW (ref 6–20)
CO2: 23 mmol/L (ref 22–32)
Calcium: 8.9 mg/dL (ref 8.9–10.3)
Chloride: 98 mmol/L (ref 98–111)
Creatinine, Ser: 0.83 mg/dL (ref 0.61–1.24)
GFR, Estimated: >60 mL/min (ref >60)
Glucose, Bld: 113 mg/dL — ABNORMAL HIGH (ref 70–99)
Potassium: 3.5 mmol/L (ref 3.5–5.1)
Sodium: 130 mmol/L — ABNORMAL LOW (ref 135–145)
Total Bilirubin: 1.6 mg/dL — ABNORMAL HIGH (ref 0.3–1.2)
Total Protein: 6.8 g/dL (ref 6.5–8.1)

## 2020-08-23 LAB — LIPASE, BLOOD: Lipase: 44 U/L (ref 11–51)

## 2020-08-23 MED ORDER — MORPHINE SULFATE (PF) 4 MG/ML IV SOLN
4.0000 mg | Freq: Once | INTRAVENOUS | Status: AC
  Administered 2020-08-23: 4 mg via INTRAVENOUS
  Filled 2020-08-23: qty 1

## 2020-08-23 MED ORDER — SODIUM CHLORIDE 0.9 % IV BOLUS
1000.0000 mL | Freq: Once | INTRAVENOUS | Status: AC
  Administered 2020-08-23: 1000 mL via INTRAVENOUS

## 2020-08-23 MED ORDER — AZITHROMYCIN 250 MG PO TABS
500.0000 mg | ORAL_TABLET | Freq: Once | ORAL | Status: AC
  Administered 2020-08-23: 500 mg via ORAL
  Filled 2020-08-23: qty 2

## 2020-08-23 MED ORDER — ONDANSETRON HCL 4 MG/2ML IJ SOLN
4.0000 mg | Freq: Once | INTRAMUSCULAR | Status: AC
  Administered 2020-08-23: 4 mg via INTRAVENOUS
  Filled 2020-08-23: qty 2

## 2020-08-23 MED ORDER — ONDANSETRON 4 MG PO TBDP: ORAL_TABLET | ORAL | 0 refills | Status: AC

## 2020-08-23 MED ORDER — AZITHROMYCIN 250 MG PO TABS: 500.0000 mg | ORAL_TABLET | Freq: Every day | ORAL | 0 refills | Status: AC

## 2020-08-23 NOTE — Discharge Instructions (Signed)
Try to avoid ingesting your well water.  Take the antibiotics as prescribed.  Please keep your appointment with your gastroenterologist.  Return for worsening abdominal pain or inability to eat or drink.  Take the nausea medicine as prescribed.  You can take Imodium for diarrhea.

## 2020-08-23 NOTE — ED Triage Notes (Signed)
Pt seen here last week for N/V/D and returns tonight with complaints of same (states no better) as well as fever, chills, and body aches. Pt also c/o LLQ abdominal pain for which he states he was to f/u with "stomach doctor" but that he could not get an appointment until October.

## 2020-08-23 NOTE — ED Notes (Signed)
ED Provider at bedside. 

## 2020-08-23 NOTE — ED Provider Notes (Signed)
[facility] EMERGENCY DEPARTMENT Provider Note   CSN: [id number] Arrival date & time: 08/23/20  0340     History Chief Complaint  Patient presents with  . Fever, Chills, Body Aches    [name] is a 55 y.o. male.  55 yo M with a chief complaint of nausea vomiting diarrhea.  Going on for about a week now.  Was seen at the onset had labs obtained and CT imaging.  Since then the patient feels like he has been getting worse.  Tells me has been having trouble eating and drinking anything.  Started feeling like he is having subjective fevers and chills as well.  The history is provided by the patient.  Illness Severity:  Moderate Onset quality:  Gradual Duration:  1 week Timing:  Constant Progression:  Worsening Chronicity:  New Associated symptoms: abdominal pain, diarrhea, nausea and vomiting   Associated symptoms: no chest pain, no congestion, no fever, no headaches, no myalgias, no rash and no shortness of breath        Past Medical History:  Diagnosis Date  . Acid reflux     Patient Active Problem List   Diagnosis Date Noted  . Dysphagia 03/10/2020  . GERD (gastroesophageal reflux disease) 03/10/2020  . Screening for colorectal cancer 03/10/2020  . ARTHROSCOPY, RIGHT KNEE, HX OF 08/22/2006  . METHICILLIN RESISTANT STAPHYLOCOCCUS AUREUS INFECTION 07/25/2006  . EMPYEMA 07/25/2006    Past Surgical History:  Procedure Laterality Date  . BIOPSY  04/16/2020   Procedure: BIOPSY;  Surgeon: Harvel Quale, MD;  [facility];  Service: Gastroenterology;;  gastric esophageal colon  . COLONOSCOPY WITH PROPOFOL N/A 04/16/2020   Procedure: COLONOSCOPY WITH PROPOFOL;  Surgeon: Harvel Quale, MD;  [facility];  Service: Gastroenterology;  Laterality: N/A;  9:45  . ESOPHAGEAL DILATION N/A 04/16/2020   Procedure: ESOPHAGEAL DILATION;  Surgeon: Harvel Quale, MD;  [facility];  Service: Gastroenterology;   Laterality: N/A;  . ESOPHAGOGASTRODUODENOSCOPY (EGD) WITH PROPOFOL N/A 04/16/2020   Procedure: ESOPHAGOGASTRODUODENOSCOPY (EGD) WITH PROPOFOL;  Surgeon: Harvel Quale, MD;  [facility];  Service: Gastroenterology;  Laterality: N/A;  . LUNG SURGERY    . POLYPECTOMY  04/16/2020   Procedure: POLYPECTOMY;  Surgeon: Harvel Quale, MD;  [facility];  Service: Gastroenterology;;  colon       Family History  Adopted: Yes  Problem Relation Age of Onset  . Heart disease Mother     Social History   Tobacco Use  . Smoking status: Current Every Day Smoker    Packs/day: 1.00    Types: Cigarettes  . Smokeless tobacco: Never Used  Substance Use Topics  . Alcohol use: Yes    Comment: social  . Drug use: Never    Home Medications Prior to Admission medications   Medication Sig Start Date End Date Taking? Authorizing Provider  azithromycin (ZITHROMAX) 250 MG tablet Take 2 tablets (500 mg total) by mouth daily for 2 days. Take first 2 tablets together, then 1 every day until finished. 08/23/20 08/25/20 Yes Deno Etienne, DO  ondansetron (ZOFRAN ODT) 4 MG disintegrating tablet 4mg  ODT q4 hours prn nausea/vomit 08/23/20  Yes Deno Etienne, DO  doxycycline (VIBRAMYCIN) 100 MG capsule Take 1 capsule (100 mg total) by mouth 2 (two) times daily. Patient not taking: No sig reported 07/15/20   Robinson, [place] N, PA-C  omeprazole (PRILOSEC) 40 MG capsule Take 1 capsule (40 mg total) by mouth daily. Patient not  taking: No sig reported 03/10/20   Montez Morita, Quillian Quince, MD  polyethylene glycol-electrolytes (TRILYTE) 420 g solution Take 4,000 mLs by mouth as directed. Patient not taking: No sig reported 07/02/20   Harvel Quale, MD    Allergies    Sulfonamide derivatives  Review of Systems   Review of Systems  Constitutional: Negative for chills and fever.  HENT: Negative for congestion and facial swelling.   Eyes: Negative for discharge and visual  disturbance.  Respiratory: Negative for shortness of breath.   Cardiovascular: Negative for chest pain and palpitations.  Gastrointestinal: Positive for abdominal pain, diarrhea, nausea and vomiting.  Musculoskeletal: Negative for arthralgias and myalgias.  Skin: Negative for color change and rash.  Neurological: Negative for tremors, syncope and headaches.  Psychiatric/Behavioral: Negative for confusion and dysphoric mood.    Physical Exam Updated Vital Signs BP (!) 141/96   Pulse 84   Temp 98.4 F (36.9 C) (Oral)   Resp 18   Ht 5\' 9"  (1.753 m)   Wt 73 kg   SpO2 97%   BMI 23.77 kg/m   Physical Exam Vitals and nursing note reviewed.  Constitutional:      Appearance: He is well-developed.  HENT:     Head: Normocephalic and atraumatic.  Eyes:     Pupils: Pupils are equal, round, and reactive to light.  Neck:     Vascular: No JVD.  Cardiovascular:     Rate and Rhythm: Regular rhythm. Tachycardia present.     Heart sounds: No murmur heard. No friction rub. No gallop.   Pulmonary:     Effort: No respiratory distress.     Breath sounds: No wheezing.  Abdominal:     General: There is no distension.     Tenderness: There is no abdominal tenderness. There is no guarding or rebound.     Comments: Benign abdominal exam  Musculoskeletal:        General: Normal range of motion.     Cervical back: Normal range of motion and neck supple.  Skin:    Coloration: Skin is not pale.     Findings: No rash.  Neurological:     Mental Status: He is alert and oriented to person, place, and time.  Psychiatric:        Behavior: Behavior normal.     ED Results / Procedures / Treatments   Labs (all labs ordered are listed, but only abnormal results are displayed) Labs Reviewed  CBC WITH DIFFERENTIAL/PLATELET - Abnormal; Notable for the following components:      Result Value   RBC 4.06 (*)    MCH 36.7 (*)    MCHC 36.9 (*)    All other components within normal limits   COMPREHENSIVE METABOLIC PANEL - Abnormal; Notable for the following components:   Sodium 130 (*)    Glucose, Bld 113 (*)    BUN <5 (*)    Total Bilirubin 1.6 (*)    All other components within normal limits  LIPASE, BLOOD  URINALYSIS, ROUTINE W REFLEX MICROSCOPIC    EKG None  Radiology No results found.  Procedures Procedures   Medications Ordered in ED Medications  sodium chloride 0.9 % bolus 1,000 mL (0 mLs Intravenous Stopped 08/23/20 0507)  morphine 4 MG/ML injection 4 mg (4 mg Intravenous Given 08/23/20 0426)  ondansetron (ZOFRAN) injection 4 mg (4 mg Intravenous Given 08/23/20 0426)  azithromycin (ZITHROMAX) tablet 500 mg (500 mg Oral Given 08/23/20 0505)    ED Course  I have reviewed  the triage vital signs and the nursing notes.  Pertinent labs & imaging results that were available during my care of the patient were reviewed by me and considered in my medical decision making (see chart for details).    MDM Rules/Calculators/A&P                          55 yo M with a chief complaint of nausea vomiting and diarrhea.  Going on for the past week.  Tachycardic on arrival.  Will obtain laboratory evaluation bolus of IV fluids pain nausea medicine reassess.  On review of his labs from his previous visit it does appear that he tested positive for EPEC on his stool studies.  Will start on azithro.   Mild bilirubin elevation likely secondary to dehydration.  Sodium is mildly low.  Patient is feeling better after IV fluids pain and nausea medicine.  Had resolution of his tachycardia.  Will discharge home.  PCP and GI follow-up.  5:13 AM:  I have discussed the diagnosis/risks/treatment options with the patient and believe the pt to be eligible for discharge home to follow-up with GI. We also discussed returning to the ED immediately if new or worsening sx occur. We discussed the sx which are most concerning (e.g., sudden worsening pain, fever, inability to tolerate by mouth) that  necessitate immediate return. Medications administered to the patient during their visit and any new prescriptions provided to the patient are listed below.  Medications given during this visit Medications  sodium chloride 0.9 % bolus 1,000 mL (0 mLs Intravenous Stopped 08/23/20 0507)  morphine 4 MG/ML injection 4 mg (4 mg Intravenous Given 08/23/20 0426)  ondansetron (ZOFRAN) injection 4 mg (4 mg Intravenous Given 08/23/20 0426)  azithromycin (ZITHROMAX) tablet 500 mg (500 mg Oral Given 08/23/20 0505)     The patient appears reasonably screen and/or stabilized for discharge and I doubt any other medical condition or other [facility] requiring further screening, evaluation, or treatment in the ED at this time prior to discharge.    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

## 2020-10-05 ENCOUNTER — Emergency Department (HOSPITAL_COMMUNITY)
Admission: EM | Admit: 2020-10-05 | Discharge: 2020-10-05 | Disposition: A | Discharge status: Home / Self Care | Payer: Self-pay | Attending: Emergency Medicine | Admitting: Emergency Medicine

## 2020-10-05 ENCOUNTER — Other Ambulatory Visit: Payer: Self-pay

## 2020-10-05 DIAGNOSIS — F1010 Alcohol abuse, uncomplicated: Secondary | ICD-10-CM | POA: Insufficient documentation

## 2020-10-05 DIAGNOSIS — Y906 Blood alcohol level of 120-199 mg/100 ml: Secondary | ICD-10-CM | POA: Insufficient documentation

## 2020-10-05 DIAGNOSIS — Z789 Other specified health status: Secondary | ICD-10-CM

## 2020-10-05 DIAGNOSIS — F17210 Nicotine dependence, cigarettes, uncomplicated: Secondary | ICD-10-CM | POA: Insufficient documentation

## 2020-10-05 LAB — ETHANOL: Alcohol, Ethyl (B): 175 mg/dL — ABNORMAL HIGH (ref <10)

## 2020-10-05 NOTE — ED Provider Notes (Signed)
[facility] EMERGENCY DEPARTMENT Provider Note   CSN: [id number] Arrival date & time: 10/05/20  1951     History Chief Complaint  Patient presents with   Drug / Alcohol Assessment    [name] is a 55 y.o. male.   Drug / Alcohol Assessment Associated symptoms: no shortness of breath and no weakness   Patient came in for alcohol testing.  States that he was pulled over and charged with DUI.  States he blew a 0.16.  States that he does not believe this since he was not drinking today.  States he did have a drink at 7 this morning but has not had any drinking today.  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  States he just wants to prove he has not been drinking.    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   EMPYEMA 07/25/2006    Past Surgical History:  Procedure Laterality Date   BIOPSY  04/16/2020   Procedure: BIOPSY;  Surgeon: Harvel Quale, MD;  [facility];  Service: Gastroenterology;;  gastric esophageal colon   COLONOSCOPY WITH PROPOFOL N/A 04/16/2020   Procedure: COLONOSCOPY WITH PROPOFOL;  Surgeon: Harvel Quale, MD;  [facility];  Service: Gastroenterology;  Laterality: N/A;  9:45   ESOPHAGEAL DILATION N/A 04/16/2020   Procedure: ESOPHAGEAL DILATION;  Surgeon: Harvel Quale, MD;  [facility];  Service: Gastroenterology;  Laterality: N/A;   ESOPHAGOGASTRODUODENOSCOPY (EGD) WITH PROPOFOL N/A 04/16/2020   Procedure: ESOPHAGOGASTRODUODENOSCOPY (EGD) WITH PROPOFOL;  Surgeon: Harvel Quale, MD;  [facility];  Service: Gastroenterology;  Laterality: N/A;   LUNG  SURGERY     POLYPECTOMY  04/16/2020   Procedure: POLYPECTOMY;  Surgeon: Harvel Quale, MD;  [facility];  Service: Gastroenterology;;  colon       Family History  Adopted: Yes  Problem Relation Age of Onset   Heart disease Mother     Social History   Tobacco Use   Smoking status: Every Day    Packs/day: 1.00    Pack years: 0.00    Types: Cigarettes   Smokeless tobacco: Never  Substance Use Topics   Alcohol use: Yes    Comment: social   Drug use: Never    Home Medications Prior to Admission medications   Medication Sig Start Date End Date Taking? Authorizing Provider  doxycycline (VIBRAMYCIN) 100 MG capsule Take 1 capsule (100 mg total) by mouth 2 (two) times daily. Patient not taking: No sig reported 07/15/20   Robinson, [place] N, PA-C  omeprazole (PRILOSEC) 40 MG capsule Take 1 capsule (40 mg total) by mouth daily. Patient not taking: No sig reported 03/10/20   Montez Morita, Quillian Quince, MD  ondansetron [facility] ODT) 4 MG disintegrating tablet 4mg  ODT q4 hours prn nausea/vomit 08/23/20   Deno Etienne, DO  polyethylene glycol-electrolytes (TRILYTE) 420 g solution Take 4,000 mLs by mouth as directed. Patient not taking: No sig reported 07/02/20   Harvel Quale, MD    Allergies    Sulfonamide derivatives  Review of Systems   Review of Systems  Constitutional:  Negative for appetite change.  Respiratory:  Negative  for shortness of breath.   Neurological:  Negative for weakness.   Physical Exam Updated Vital Signs BP (!) 144/109 (BP Location: Right Arm)   Pulse 88   Temp 97.8 F (36.6 C) (Oral)   Resp 20   Ht 5\' 10"  (1.778 m)   Wt 70.3 kg   SpO2 96%   BMI 22.24 kg/m   Physical Exam Vitals reviewed.  HENT:     Head: Atraumatic.  Eyes:     Pupils: Pupils are equal, round, and reactive to light.  Cardiovascular:     Rate and Rhythm: Regular rhythm.  Pulmonary:     Comments: Mildly harsh breath sounds. Abdominal:      Tenderness: There is no abdominal tenderness.  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  Neurological:     Mental Status: He is alert and oriented to person, place, and time.  Psychiatric:        Mood and Affect: Mood normal.    ED Results / Procedures / Treatments   Labs (all labs ordered are listed, but only abnormal results are displayed) Labs Reviewed  ETHANOL - Abnormal; Notable for the following components:      Result Value   Alcohol, Ethyl (B) 175 (*)    All other components within normal limits    EKG None  Radiology No results found.  Procedures Procedures   Medications Ordered in ED Medications - No data to display  ED Course  I have reviewed the triage vital signs and the nursing notes.  Pertinent labs & imaging results that were available during my care of the patient were reviewed by me and considered in my medical decision making (see chart for details).    MDM Rules/Calculators/A&P                          Patient came in for a alcohol level check.  States he got pulled over for DUI and states he has not been drinking.  Reportedly blew 0.16.  Patient states he has not drank at all since this morning.  Alcohol level drawn and showed a level of 175.  This is roughly equivalent to his breath level.  Patient states he does not believe this level either. Final Clinical Impression(s) / ED Diagnoses Final diagnoses:  Alcohol use    Rx / DC Orders ED Discharge Orders     None        Davonna Belling, MD 10/05/20 2112

## 2020-10-05 NOTE — ED Triage Notes (Signed)
Pt was pulled over by a state trooper and charged with a DUI. Pt does not believe the test and wants his own. States he blew a .16

## 2020-10-06 ENCOUNTER — Encounter (INDEPENDENT_AMBULATORY_CARE_PROVIDER_SITE_OTHER): Payer: Self-pay | Admitting: Gastroenterology

## 2020-10-06 ENCOUNTER — Ambulatory Visit (INDEPENDENT_AMBULATORY_CARE_PROVIDER_SITE_OTHER): Payer: No Typology Code available for payment source | Admitting: Gastroenterology

## 2020-12-23 ENCOUNTER — Ambulatory Visit (INDEPENDENT_AMBULATORY_CARE_PROVIDER_SITE_OTHER): Payer: No Typology Code available for payment source | Admitting: Internal Medicine

## 2021-01-14 ENCOUNTER — Emergency Department (HOSPITAL_COMMUNITY): Payer: Self-pay

## 2021-01-14 ENCOUNTER — Emergency Department (HOSPITAL_COMMUNITY)
Admission: EM | Admit: 2021-01-14 | Discharge: 2021-01-14 | Disposition: A | Discharge status: Home / Self Care | Payer: Self-pay | Attending: Emergency Medicine | Admitting: Emergency Medicine

## 2021-01-14 ENCOUNTER — Other Ambulatory Visit: Payer: Self-pay

## 2021-01-14 ENCOUNTER — Encounter (HOSPITAL_COMMUNITY): Payer: Self-pay | Admitting: Emergency Medicine

## 2021-01-14 DIAGNOSIS — Z20822 Contact with and (suspected) exposure to covid-19: Secondary | ICD-10-CM | POA: Insufficient documentation

## 2021-01-14 DIAGNOSIS — R197 Diarrhea, unspecified: Secondary | ICD-10-CM | POA: Insufficient documentation

## 2021-01-14 DIAGNOSIS — R112 Nausea with vomiting, unspecified: Secondary | ICD-10-CM | POA: Insufficient documentation

## 2021-01-14 DIAGNOSIS — R6883 Chills (without fever): Secondary | ICD-10-CM | POA: Insufficient documentation

## 2021-01-14 DIAGNOSIS — R030 Elevated blood-pressure reading, without diagnosis of hypertension: Secondary | ICD-10-CM

## 2021-01-14 DIAGNOSIS — R519 Headache, unspecified: Secondary | ICD-10-CM | POA: Insufficient documentation

## 2021-01-14 DIAGNOSIS — F17210 Nicotine dependence, cigarettes, uncomplicated: Secondary | ICD-10-CM | POA: Insufficient documentation

## 2021-01-14 LAB — COMPREHENSIVE METABOLIC PANEL
ALT: 42 U/L (ref 0–44)
AST: 93 U/L — ABNORMAL HIGH (ref 15–41)
Albumin: 3.4 g/dL — ABNORMAL LOW (ref 3.5–5.0)
Alkaline Phosphatase: 64 U/L (ref 38–126)
Anion gap: 10 (ref 5–15)
BUN: 12 mg/dL (ref 6–20)
CO2: 24 mmol/L (ref 22–32)
Calcium: 8.2 mg/dL — ABNORMAL LOW (ref 8.9–10.3)
Chloride: 100 mmol/L (ref 98–111)
Creatinine, Ser: 0.89 mg/dL (ref 0.61–1.24)
GFR, Estimated: >60 mL/min (ref >60)
Glucose, Bld: 100 mg/dL — ABNORMAL HIGH (ref 70–99)
Potassium: 3.5 mmol/L (ref 3.5–5.1)
Sodium: 134 mmol/L — ABNORMAL LOW (ref 135–145)
Total Bilirubin: 1.1 mg/dL (ref 0.3–1.2)
Total Protein: 6.6 g/dL (ref 6.5–8.1)

## 2021-01-14 LAB — CBC WITH DIFFERENTIAL/PLATELET
Abs Immature Granulocytes: 0.01 10*3/uL (ref 0.00–0.07)
Basophils Absolute: 0.1 10*3/uL (ref 0.0–0.1)
Basophils Relative: 2 %
Eosinophils Absolute: 0 10*3/uL (ref 0.0–0.5)
Eosinophils Relative: 0 %
HCT: 40.5 % (ref 39.0–52.0)
Hemoglobin: 14.8 g/dL (ref 13.0–17.0)
Immature Granulocytes: 0 %
Lymphocytes Relative: 31 %
Lymphs Abs: 1.4 10*3/uL (ref 0.7–4.0)
MCH: 36.3 pg — ABNORMAL HIGH (ref 26.0–34.0)
MCHC: 36.5 g/dL — ABNORMAL HIGH (ref 30.0–36.0)
MCV: 99.3 fL (ref 80.0–100.0)
Monocytes Absolute: 0.8 10*3/uL (ref 0.1–1.0)
Monocytes Relative: 19 %
Neutro Abs: 2.2 10*3/uL (ref 1.7–7.7)
Neutrophils Relative %: 48 %
Platelets: 194 10*3/uL (ref 150–400)
RBC: 4.08 MIL/uL — ABNORMAL LOW (ref 4.22–5.81)
RDW: 14.7 % (ref 11.5–15.5)
WBC: 4.5 10*3/uL (ref 4.0–10.5)
nRBC: 0.4 % — ABNORMAL HIGH (ref 0.0–0.2)

## 2021-01-14 LAB — RESP PANEL BY RT-PCR (FLU A&B, COVID) ARPGX2
Influenza A by PCR: NEGATIVE
Influenza B by PCR: NEGATIVE
SARS Coronavirus 2 by RT PCR: NEGATIVE

## 2021-01-14 LAB — LIPASE, BLOOD: Lipase: 39 U/L (ref 11–51)

## 2021-01-14 MED ORDER — SODIUM CHLORIDE 0.9 % IV BOLUS
1000.0000 mL | Freq: Once | INTRAVENOUS | Status: AC
  Administered 2021-01-14: 1000 mL via INTRAVENOUS

## 2021-01-14 MED ORDER — ACETAMINOPHEN 325 MG PO TABS
650.0000 mg | ORAL_TABLET | Freq: Once | ORAL | Status: AC
  Administered 2021-01-14: 650 mg via ORAL
  Filled 2021-01-14: qty 2

## 2021-01-14 MED ORDER — ONDANSETRON HCL 4 MG/2ML IJ SOLN
4.0000 mg | Freq: Once | INTRAMUSCULAR | Status: AC
  Administered 2021-01-14: 4 mg via INTRAVENOUS
  Filled 2021-01-14: qty 2

## 2021-01-14 MED ORDER — KETOROLAC TROMETHAMINE 30 MG/ML IJ SOLN
30.0000 mg | Freq: Once | INTRAMUSCULAR | Status: AC
  Administered 2021-01-14: 30 mg via INTRAVENOUS
  Filled 2021-01-14: qty 1

## 2021-01-14 NOTE — ED Provider Notes (Signed)
Signed out to d/c to home when labs back.  Labs resulted. Ast > alt. No abd pain or tenderness on exam. Hx recent heavy etoh use. Given headache, hx heavy etoh, will get ct.   Ct neg for hem/acute process. No sinus or temporal tenderness.   Po meds provided. Po fluids/food.   Recheck, tolerating po, no nv, abd soft nt. Bp 14995, hr 76.  Pt currently appears stable for d/c.      Lajean Saver, MD 01/14/21 1146

## 2021-01-14 NOTE — ED Triage Notes (Signed)
Pt c/o headache, n/v/d x 3 days

## 2021-01-14 NOTE — Discharge Instructions (Addendum)
It was our pleasure to provide your ER care today - we hope that you feel better.  Drink plenty of fluids/stay well hydrated.  Continue prilosec. Take acetaminophen as need. Avoid alcohol use.   Follow up with primary care doctor in one week - also have blood pressure rechecked then, as it is high today.   Return to ER if worse, new symptoms, fevers, new, severe, or worsening abdominal pain, persistent vomiting, chest pain, trouble breathing, severe headache, or other concern.

## 2021-01-14 NOTE — ED Notes (Signed)
Girlfriend verbalized pt has been drinking alcohol daily and not eating.

## 2021-01-14 NOTE — ED Notes (Signed)
Pt unable to have BM at this time for GI panel. Pt knows sample is needed.

## 2021-01-14 NOTE — ED Provider Notes (Signed)
[facility] Provider Note   CSN: [id number] Arrival date & time: 01/14/21  0600     History Chief Complaint  Patient presents with   Headache    [name] is a 55 y.o. male.  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  Patient presenting today with complaints of headache, chills, nausea, vomiting, and diarrhea that has been worsening over the past 3 days.  He describes very little oral intake during this period of time.  He denies any bloody stool or vomit.  He denies any ill contacts.  He feels similar to what he experienced with his E. coli infection.  The history is provided by the patient.      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   EMPYEMA 07/25/2006    Past Surgical History:  Procedure Laterality Date   BIOPSY  04/16/2020   Procedure: BIOPSY;  Surgeon: Harvel Quale, MD;  [facility]: [facility];  Service: Gastroenterology;;  gastric esophageal colon   COLONOSCOPY WITH PROPOFOL N/A 04/16/2020   Procedure: COLONOSCOPY WITH PROPOFOL;  Surgeon: Harvel Quale, MD;  Location: [facility];  Service: Gastroenterology;  Laterality: N/A;  9:45   ESOPHAGEAL DILATION N/A 04/16/2020   Procedure: ESOPHAGEAL DILATION;  Surgeon: Harvel Quale, MD;  Location: [facility];  Service: Gastroenterology;  Laterality: N/A;   ESOPHAGOGASTRODUODENOSCOPY (EGD) WITH PROPOFOL N/A 04/16/2020   Procedure: ESOPHAGOGASTRODUODENOSCOPY (EGD) WITH PROPOFOL;  Surgeon: Harvel Quale, MD;  Location: [facility];  Service: Gastroenterology;  Laterality: N/A;    LUNG SURGERY     POLYPECTOMY  04/16/2020   Procedure: POLYPECTOMY;  Surgeon: Harvel Quale, MD;  Location: [facility];  Service: Gastroenterology;;  colon       Family History  Adopted: Yes  Problem Relation Age of Onset   Heart disease Mother     Social History   Tobacco Use   Smoking status: Every Day    Packs/day: 1.00    Types: Cigarettes   Smokeless tobacco: Never  Substance Use Topics   Alcohol use: Yes    Comment: social   Drug use: Never    Home Medications Prior to Admission medications   Medication Sig Start Date End Date Taking? Authorizing Provider  doxycycline (VIBRAMYCIN) 100 MG capsule Take 1 capsule (100 mg total) by mouth 2 (two) times daily. Patient not taking: No sig reported 07/15/20   Robinson, [place] N, PA-C  omeprazole (PRILOSEC) 40 MG capsule Take 1 capsule (40 mg total) by mouth daily. Patient not taking: No sig reported 03/10/20   Montez Morita, Quillian Quince, MD  ondansetron [facility] ODT) 4 MG disintegrating tablet 4mg  ODT q4 hours prn nausea/vomit 08/23/20   Deno Etienne, DO  polyethylene glycol-electrolytes (TRILYTE) 420 g solution Take 4,000 mLs by mouth as directed. Patient not taking: No sig reported 07/02/20   Harvel Quale, MD    Allergies    Sulfonamide derivatives  Review of Systems   Review of Systems  All other systems reviewed and are negative.  Physical Exam Updated Vital Signs BP (!) 146/102 (BP Location: Right Arm)   Pulse Marland Kitchen)  110   Temp 98 F (36.7 C) (Oral)   Resp 19   Ht 5\' 10"  (1.778 m)   Wt 70.3 kg   SpO2 95%   BMI 22.24 kg/m   Physical Exam Vitals and nursing note reviewed.  Constitutional:      General: He is not in acute distress.    Appearance: He is well-developed. He is not diaphoretic.  HENT:     Head: Normocephalic and atraumatic.     Mouth/Throat:     Mouth: Mucous membranes are moist.  Cardiovascular:     Rate and Rhythm: Normal rate and regular rhythm.     Heart sounds: No  murmur heard.   No friction rub.  Pulmonary:     Effort: Pulmonary effort is normal. No respiratory distress.     Breath sounds: Normal breath sounds. No wheezing or rales.  Abdominal:     General: Bowel sounds are normal. There is no distension.     Palpations: Abdomen is soft.     Tenderness: There is no abdominal tenderness.  Musculoskeletal:        General: Normal range of motion.     Cervical back: Normal range of motion and neck supple.  Skin:    General: Skin is warm and dry.  Neurological:     Mental Status: He is alert and oriented to person, place, and time.     Coordination: Coordination normal.    ED Results / Procedures / Treatments   Labs (all labs ordered are listed, but only abnormal results are displayed) Labs Reviewed - No data to display  EKG None  Radiology No results found.  Procedures Procedures   Medications Ordered in ED Medications  sodium chloride 0.9 % bolus 1,000 mL (has no administration in time range)  ketorolac (TORADOL) 30 MG/ML injection 30 mg (has no administration in time range)  ondansetron (ZOFRAN) injection 4 mg (has no administration in time range)    ED Course  I have reviewed the triage vital signs and the nursing notes.  Pertinent labs & imaging results that were available during my care of the patient were reviewed by me and considered in my medical decision making (see chart for details).    MDM Rules/Calculators/A&P  Patient presenting here with complaints of headache, nausea, vomiting, and GI symptoms for the past 3 days.  He has history of E. coli infection in the past and this feels the same.  Laboratory studies initiated along with IV fluids and medicine for pain and nausea.  Care will be signed out to Dr. Ashok Cordia at shift change.  He will reassess patient after laboratory studies returned and determine the final disposition.  Final Clinical Impression(s) / ED Diagnoses Final diagnoses:  None    Rx / DC Orders ED  Discharge Orders     None        Veryl Speak, MD 01/15/21 [phone]

## 2021-09-02 IMAGING — DX DG CHEST 2V
2 series · 2 of 2 positions shown · non-contrast
Comparison: Prior chest x-ray 07/24/2009

CLINICAL DATA: Chest pain

EXAM:
CHEST - 2 VIEW

[chest pa]
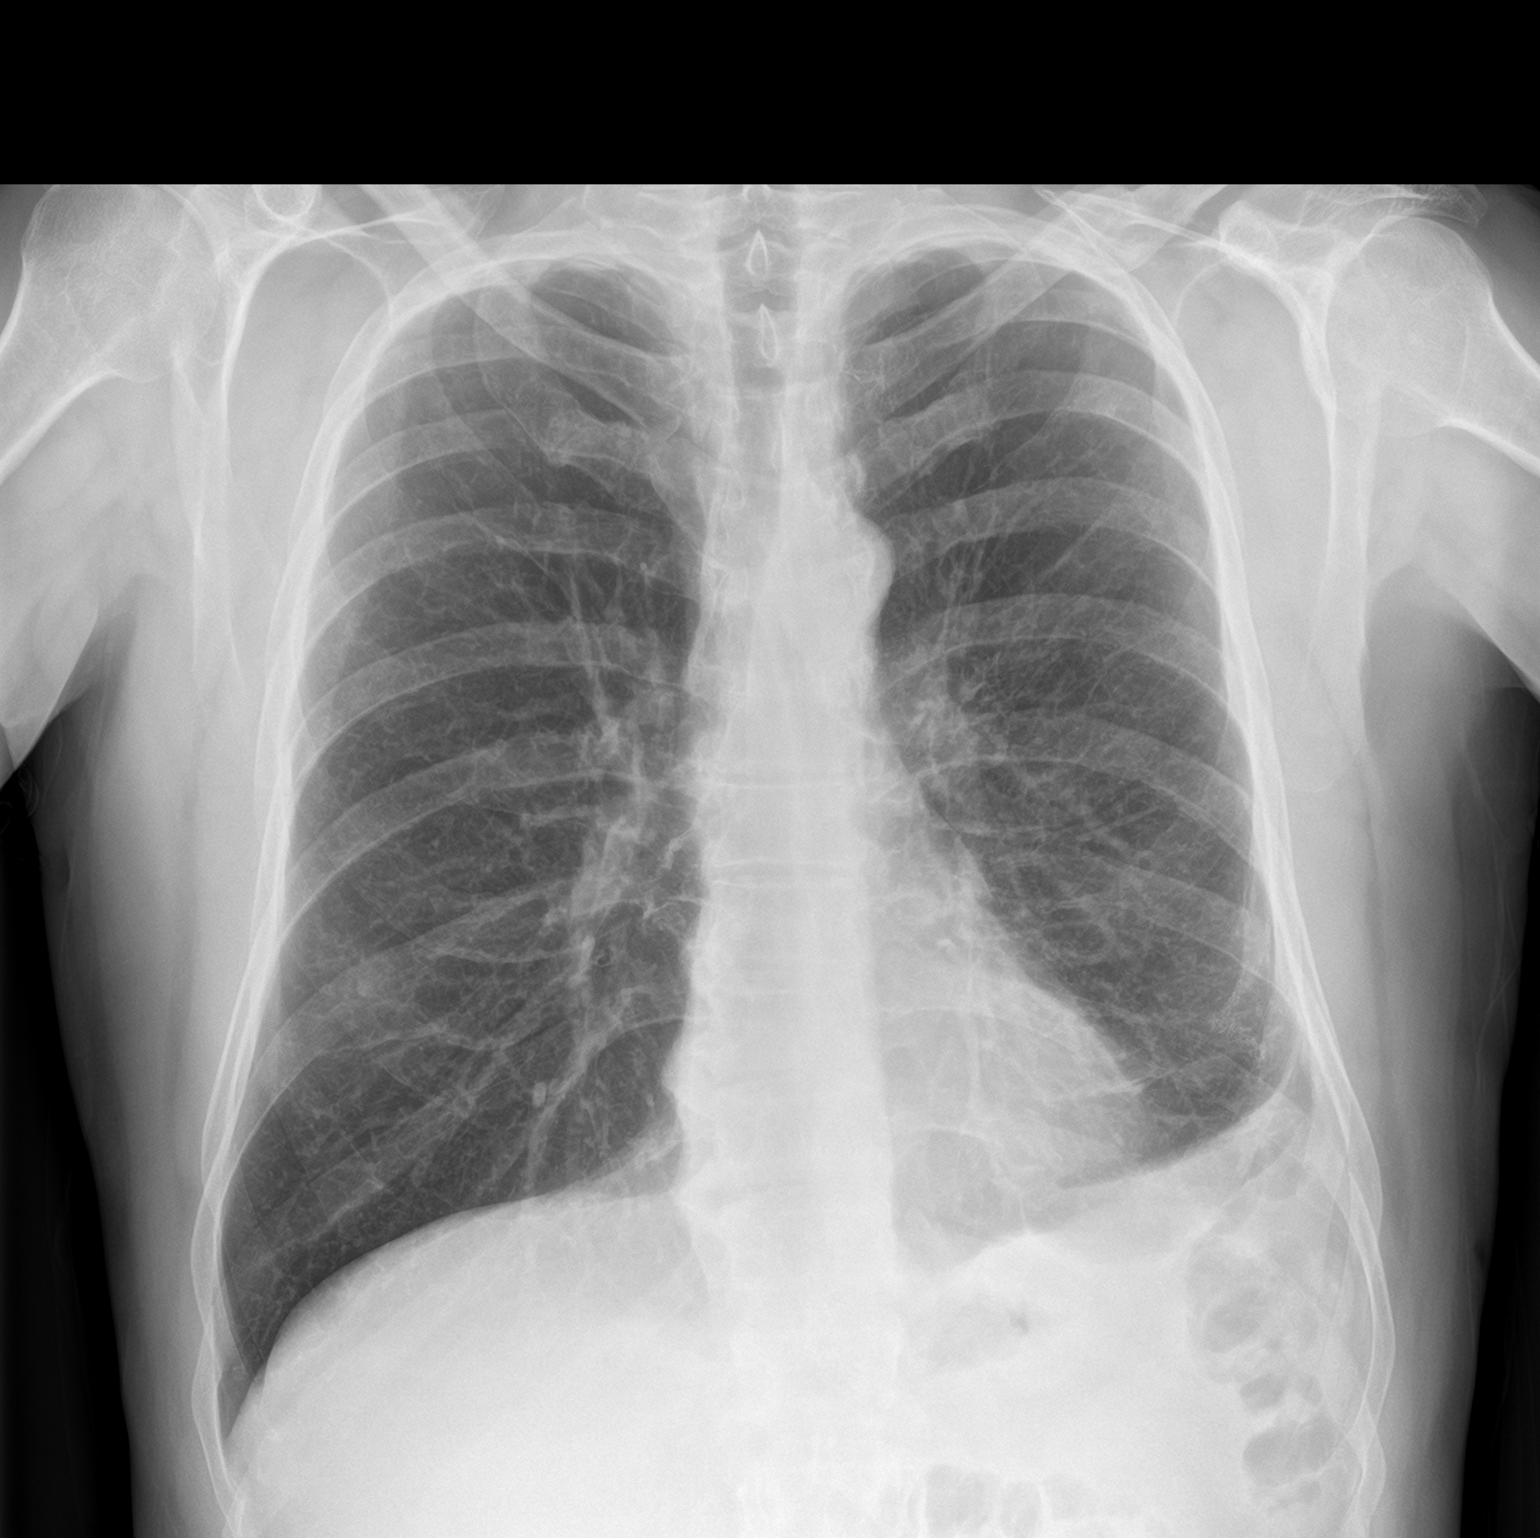

[chest lat]
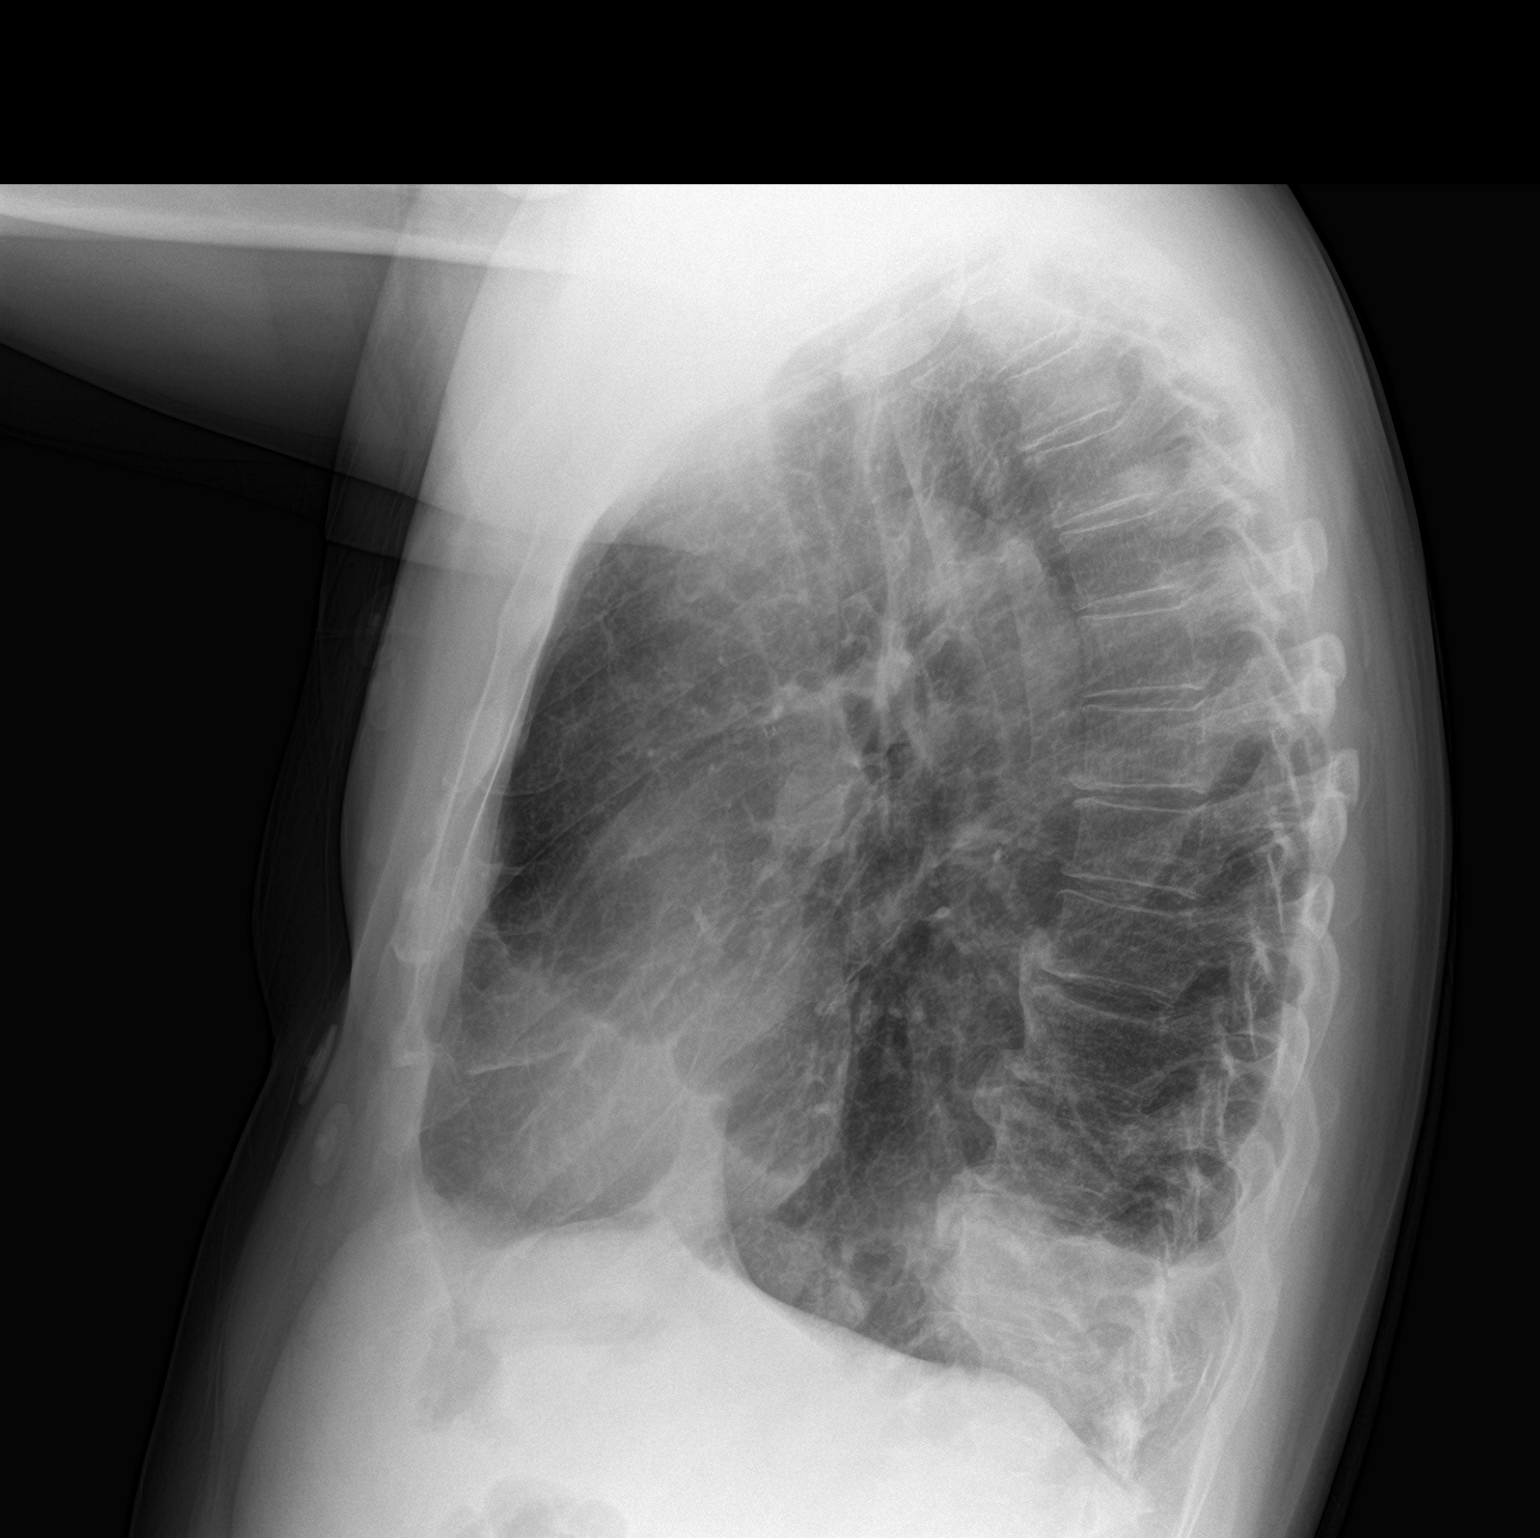

[2 of 2 positions shown; findings below may reference images not displayed]

FINDINGS: Surgical changes in the left lower lobe consistent with prior wedge
resection. However, there is a new (compared to prior imaging from
1999) opacity in the posterior and lateral aspect of the left lung
base. Otherwise, the lungs are clear save for some mild chronic
bronchitic changes. The cardiac and mediastinal contours are within
normal limits. No pneumothorax. No acute osseous abnormality. Remote
healed left mid clavicular fracture.
IMPRESSION: 1. Compared to prior imaging from 1999, new opacity in the
posterolateral left lower lobe. Differential considerations include
loculated pneumothorax with atelectasis versus infiltrate.

## 2023-03-30 ENCOUNTER — Encounter (INDEPENDENT_AMBULATORY_CARE_PROVIDER_SITE_OTHER): Payer: Self-pay | Admitting: *Deleted
